# Patient Record
Sex: Male | Born: 1970 | Race: Black or African American | Hispanic: No | Marital: Single | Smoking: Current every day smoker
Health system: Southern US, Community
[De-identification: ages and names within clinical notes are randomized; demographics above are authoritative.]

---

## 2011-02-02 ENCOUNTER — Emergency Department: Payer: Self-pay | Admitting: Emergency Medicine

## 2013-02-03 IMAGING — CR DG CHEST 2V
1 series · 2 of 2 positions shown · non-contrast
Comparison: none

REASON FOR EXAM: dyspnea
COMMENTS:   LMP: (Male)

[Series 1: view not recorded · 0.17mm/px · 2 of 2 slices shown]
[im 1/2]
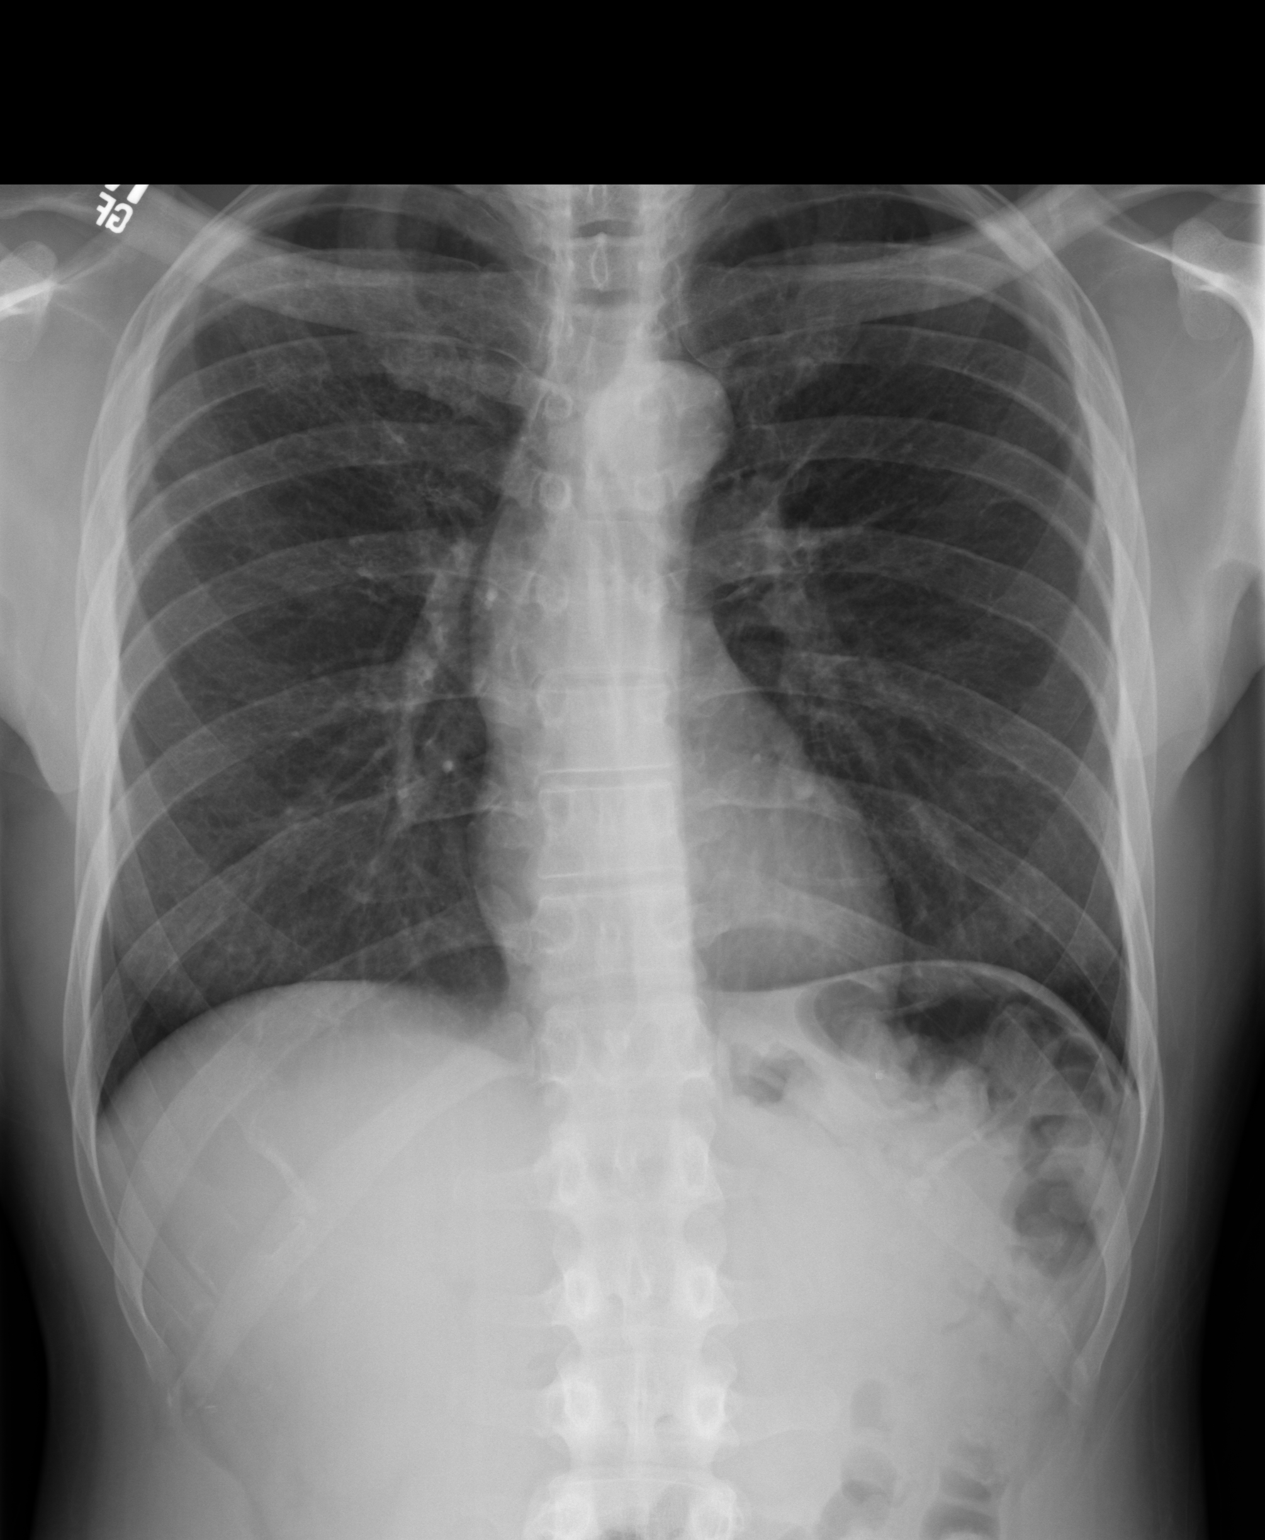
[im 2/2]
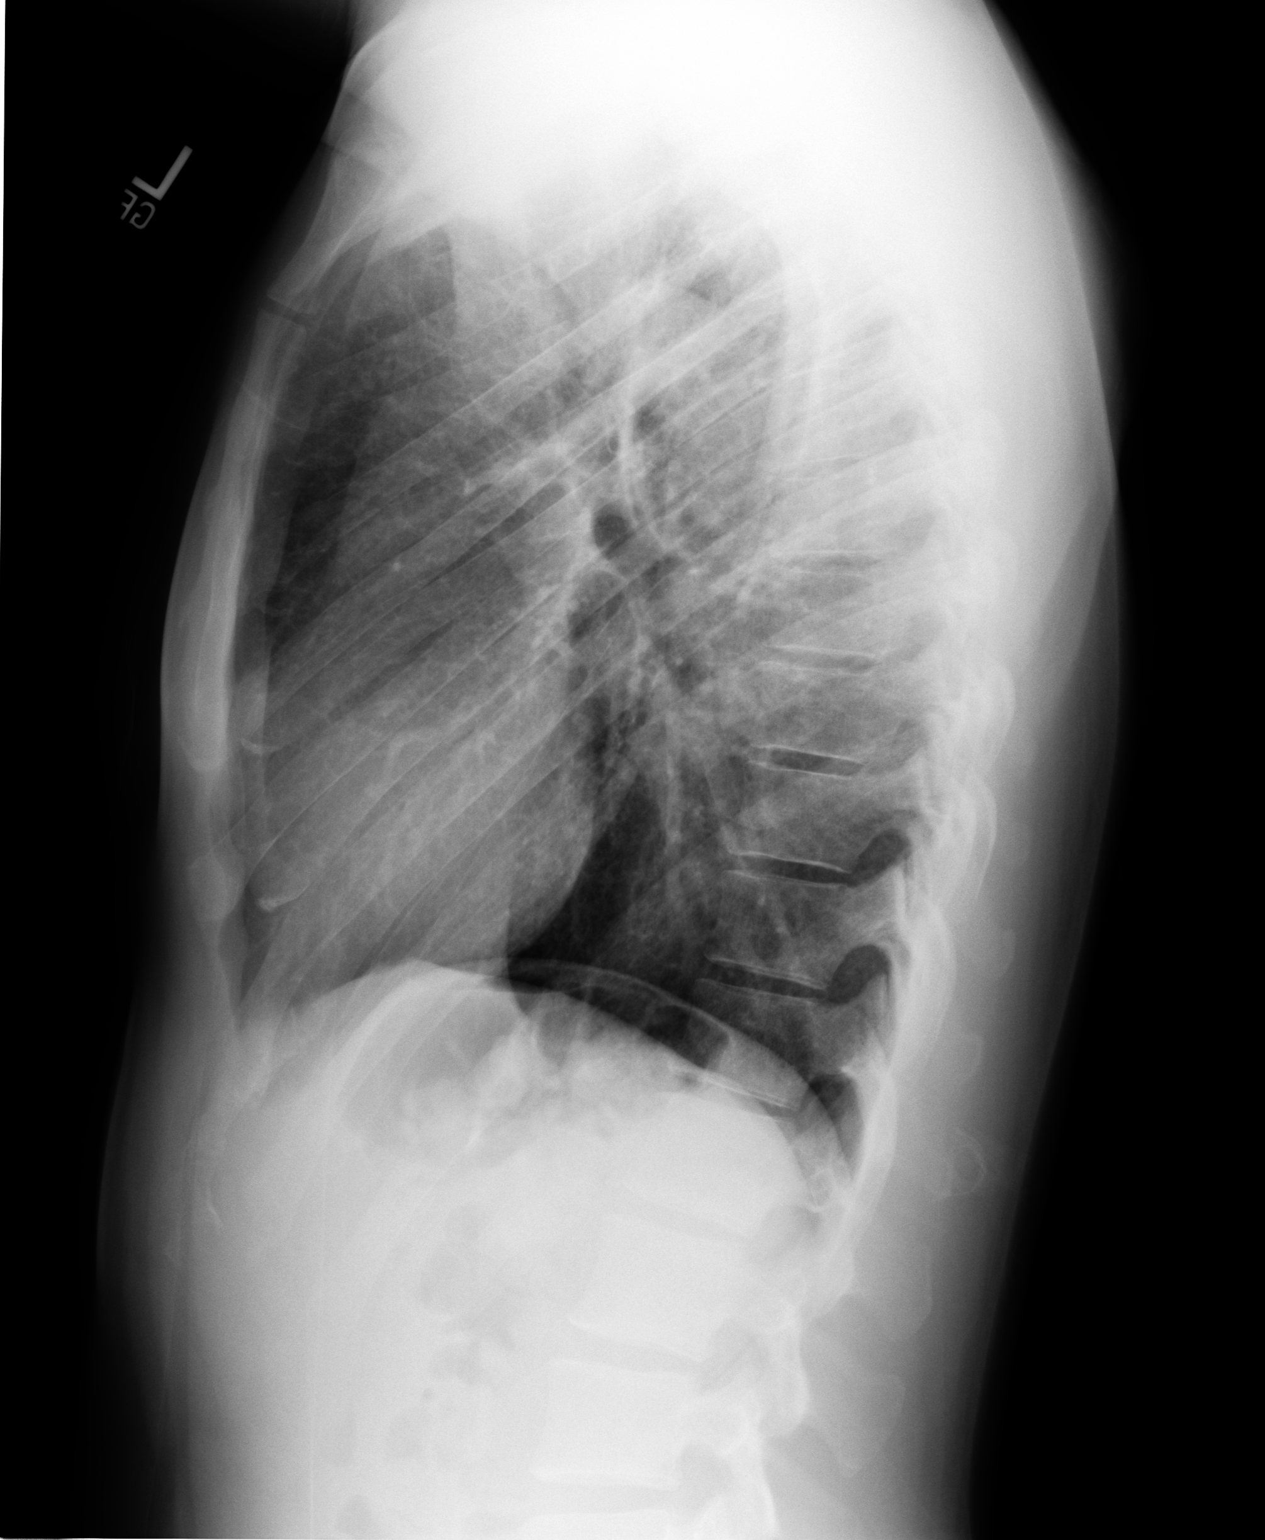

[2 of 2 positions shown; findings below may reference images not displayed]

PROCEDURE:     DXR - DXR CHEST PA (OR AP) AND LATERAL  - February 02, 2011  [DATE]

RESULT:     The lungs are adequately inflated. There is no focal infiltrate.
I see no pleural effusion. The cardiac silhouette is normal in size. The
pulmonary vascularity is not engorged. The bony thorax exhibits no acute
abnormality.
IMPRESSION: I do not see evidence of acute cardiopulmonary abnormality

## 2016-09-19 ENCOUNTER — Emergency Department
Admission: EM | Admit: 2016-09-19 | Discharge: 2016-09-19 | Disposition: A | Discharge status: Home / Self Care | Payer: Worker's Compensation | Attending: Emergency Medicine | Admitting: Emergency Medicine

## 2016-09-19 ENCOUNTER — Emergency Department: Payer: Worker's Compensation

## 2016-09-19 ENCOUNTER — Encounter: Payer: Self-pay | Admitting: Emergency Medicine

## 2016-09-19 DIAGNOSIS — F17210 Nicotine dependence, cigarettes, uncomplicated: Secondary | ICD-10-CM | POA: Diagnosis not present

## 2016-09-19 DIAGNOSIS — Y9389 Activity, other specified: Secondary | ICD-10-CM | POA: Insufficient documentation

## 2016-09-19 DIAGNOSIS — Y990 Civilian activity done for income or pay: Secondary | ICD-10-CM | POA: Insufficient documentation

## 2016-09-19 DIAGNOSIS — W228XXA Striking against or struck by other objects, initial encounter: Secondary | ICD-10-CM | POA: Insufficient documentation

## 2016-09-19 DIAGNOSIS — Y929 Unspecified place or not applicable: Secondary | ICD-10-CM | POA: Diagnosis not present

## 2016-09-19 DIAGNOSIS — Z23 Encounter for immunization: Secondary | ICD-10-CM | POA: Diagnosis not present

## 2016-09-19 DIAGNOSIS — S61031A Puncture wound without foreign body of right thumb without damage to nail, initial encounter: Secondary | ICD-10-CM | POA: Insufficient documentation

## 2016-09-19 DIAGNOSIS — S61239A Puncture wound without foreign body of unspecified finger without damage to nail, initial encounter: Secondary | ICD-10-CM

## 2016-09-19 DIAGNOSIS — S6991XA Unspecified injury of right wrist, hand and finger(s), initial encounter: Secondary | ICD-10-CM | POA: Diagnosis present

## 2016-09-19 MED ORDER — TRAMADOL HCL 50 MG PO TABS: 50.0000 mg | ORAL_TABLET | Freq: Four times a day (QID) | ORAL | 0 refills | Status: DC | PRN

## 2016-09-19 MED ORDER — TETANUS-DIPHTH-ACELL PERTUSSIS 5-2.5-18.5 LF-MCG/0.5 IM SUSP
0.5000 mL | Freq: Once | INTRAMUSCULAR | Status: AC
  Administered 2016-09-19: 0.5 mL via INTRAMUSCULAR
  Filled 2016-09-19: qty 0.5

## 2016-09-19 NOTE — ED Triage Notes (Signed)
[facility] [employer] [facility] [employer] Trying to loosen a bolt, the T handle of the [place] wrench broke , and the broken end went through glove and into right thumb.    Puncture wound to right thumb.  Bleeding controlled.  Darker area seen below puncture wound.

## 2016-09-19 NOTE — ED Provider Notes (Signed)
[facility]-EMERGENCY DEPARTMENT Provider Note   CSN: [id number] Arrival date & time: 09/19/16  2010     History   Chief Complaint Chief Complaint  Patient presents with  . thumb injury    HPI Manuel Miles is a 45 y.o. male presents to the emergency department for evaluation of right thumb pain. Patient was at work just prior to arrival, he was torquing a ranch with his right hand when the wrench broke, stabbed his right thumb. Manuel Miles is metal. His pain is very mild. His tetanus is not up-to-date. He is right-hand dominant. Denies any limited range of motion. No significant swelling. Wound was thoroughly irrigated.  HPI  History reviewed. No pertinent past medical history.  There are no active problems to display for this patient.   History reviewed. No pertinent surgical history.     Home Medications    Prior to Admission medications   Medication Sig Start Date End Date Taking? Authorizing Provider  traMADol (ULTRAM) 50 MG tablet Take 1 tablet (50 mg total) by mouth every 6 (six) hours as needed. 09/19/16   Evon Slack, PA-C    Family History No family history on file.  Social History Social History  Substance Use Topics  . Smoking status: Current Every Day Smoker    Packs/day: 0.50    Types: Cigarettes  . Smokeless tobacco: Never Used  . Alcohol use 0.6 oz/week    1 Cans of beer per week     Comment: daily     Allergies   Review of patient's allergies indicates no known allergies.   Review of Systems Review of Systems  Constitutional: Negative.  Negative for activity change, appetite change, chills and fever.  HENT: Negative for congestion, ear pain, mouth sores, rhinorrhea, sinus pressure, sore throat and trouble swallowing.   Eyes: Negative for photophobia, pain and discharge.  Respiratory: Negative for cough, chest tightness and shortness of breath.   Cardiovascular: Negative for chest pain and leg swelling.  Gastrointestinal: Negative for  abdominal distention, abdominal pain, diarrhea, nausea and vomiting.  Genitourinary: Negative for difficulty urinating and dysuria.  Musculoskeletal: Negative for arthralgias, back pain and gait problem.  Skin: Positive for wound. Negative for color change and rash.  Neurological: Negative for dizziness and headaches.  Hematological: Negative for adenopathy.  Psychiatric/Behavioral: Negative for agitation and behavioral problems.     Physical Exam Updated Vital Signs BP (!) 155/97 (BP Location: Right Arm)   Pulse 89   Temp 98 F (36.7 C) (Oral)   Resp 16   Ht 5\' 11"  (1.803 m)   Wt 72.6 kg   SpO2 100%   BMI 22.32 kg/m   Physical Exam  Constitutional: He appears well-developed and well-nourished.  HENT:  Head: Normocephalic and atraumatic.  Eyes: Conjunctivae are normal.  Neck: Neck supple.  Cardiovascular: Normal rate.   Pulmonary/Chest: Effort normal. No respiratory distress.  Musculoskeletal: He exhibits no edema.  Examination the right hand and thumb shows the patient has no swelling warmth erythema. The volar aspect of the right thumb at the distal phalanx shows a small puncture wound, 3-4 mm in diameter. There is no palpable foreign body. He is nontender to palpation. No tendon deficits noted.  Neurological: He is alert.  Skin: Skin is warm and dry.  Psychiatric: He has a normal mood and affect.  Nursing note and vitals reviewed.    ED Treatments / Results  Labs (all labs ordered are listed, but only abnormal results are displayed) Labs Reviewed - No data  to display  EKG  EKG Interpretation None       Radiology Dg Finger Thumb Right  Result Date: 09/19/2016 CLINICAL DATA:  Puncture wound to right thumb. Thumb pain and swelling. Initial encounter. EXAM: RIGHT THUMB 2+V COMPARISON:  None. FINDINGS: There is no evidence of fracture or dislocation. There is no evidence of arthropathy or other focal bone abnormality. No evidence of radiopaque foreign body.  IMPRESSION: Negative.  No evidence of fracture or radiopaque foreign body. Electronically Signed   By: Myles RosenthalJohn  Stahl M.D.   On: 09/19/2016 20:48    Procedures Procedures (including critical care time)  Medications Ordered in ED Medications  Tdap (BOOSTRIX) injection 0.5 mL (not administered)     Initial Impression / Assessment and Plan / ED Course  I have reviewed the triage vital signs and the nursing notes.  Pertinent labs & imaging results that were available during my care of the patient were reviewed by me and considered in my medical decision making (see chart for details).  Clinical Course    45 year old male with puncture wound to the right thumb. Puncture wound appears to be superficial. X-rays are negative showing no radiopaque foreign body and no evidence of acute fracture. There appears to be no tendon deficits noted. He is given a prescription for tramadol for pain. He will keep clean and dry and soap and peroxide and tap water daily. He will call for any signs of infection. Patient can return to work on Monday, 09/23/2016.  Final Clinical Impressions(s) / ED Diagnoses   Final diagnoses:  Puncture wound of finger of right hand, initial encounter    New Prescriptions New Prescriptions   TRAMADOL (ULTRAM) 50 MG TABLET    Take 1 tablet (50 mg total) by mouth every 6 (six) hours as needed.     Evon Slackhomas C Poseidon Pam, PA-C 09/19/16 2222    Jennye MoccasinBrian S Quigley, MD 09/19/16 [phone]

## 2016-09-19 NOTE — ED Notes (Signed)
Workman's Comp completed. Papers on the chart and specimen walked down to lab.

## 2016-09-19 NOTE — Discharge Instructions (Signed)
Please soak thumb and half peroxide, half water 3 times daily for 2-3 days. Wear a Band-Aid, keep puncture wound site clean. Return to the ER for any increased pain, redness warmth or drainage.

## 2018-02-19 ENCOUNTER — Encounter: Payer: Self-pay | Admitting: Family Medicine

## 2018-02-19 ENCOUNTER — Ambulatory Visit (INDEPENDENT_AMBULATORY_CARE_PROVIDER_SITE_OTHER): Payer: BLUE CROSS/BLUE SHIELD | Admitting: Family Medicine

## 2018-02-19 VITALS — BP 144/86 | HR 80 | Ht 71.0 in | Wt 162.0 lb

## 2018-02-19 DIAGNOSIS — L738 Other specified follicular disorders: Secondary | ICD-10-CM | POA: Diagnosis not present

## 2018-02-19 DIAGNOSIS — R030 Elevated blood-pressure reading, without diagnosis of hypertension: Secondary | ICD-10-CM | POA: Diagnosis not present

## 2018-02-19 DIAGNOSIS — L700 Acne vulgaris: Secondary | ICD-10-CM | POA: Diagnosis not present

## 2018-02-19 MED ORDER — DOXYCYCLINE HYCLATE 100 MG PO TABS: 100.0000 mg | ORAL_TABLET | Freq: Two times a day (BID) | ORAL | 0 refills | Status: DC

## 2018-02-19 NOTE — Patient Instructions (Addendum)
Folliculitis Folliculitis is inflammation of the hair follicles. Folliculitis most commonly occurs on the scalp, thighs, legs, back, and buttocks. However, it can occur anywhere on the body. What are the causes? This condition may be caused by:  A bacterial infection (common).  A fungal infection.  A viral infection.  Coming into contact with certain chemicals, especially oils and tars.  Shaving or waxing.  Applying greasy ointments or creams to your skin often.  Long-lasting folliculitis and folliculitis that keeps coming back can be caused by bacteria that live in the nostrils. What increases the risk? This condition is more likely to develop in people with:  A weakened immune system.  Diabetes.  Obesity.  What are the signs or symptoms? Symptoms of this condition include:  Redness.  Soreness.  Swelling.  Itching.  Small white or yellow, pus-filled, itchy spots (pustules) that appear over a reddened area. If there is an infection that goes deep into the follicle, these may develop into a boil (furuncle).  A group of closely packed boils (carbuncle). These tend to form in hairy, sweaty areas of the body.  How is this diagnosed? This condition is diagnosed with a skin exam. To find what is causing the condition, your health care provider may take a sample of one of the pustules or boils for testing. How is this treated? This condition may be treated by:  Applying warm compresses to the affected areas.  Taking an antibiotic medicine or applying an antibiotic medicine to the skin.  Applying or bathing with an antiseptic solution.  Taking an over-the-counter medicine to help with itching.  Having a procedure to drain any pustules or boils. This may be done if a pustule or boil contains a lot of pus or fluid.  Laser hair removal. This may be done to treat long-lasting folliculitis.  Follow these instructions at home:  If directed, apply heat to the affected  area as often as told by your health care provider. Use the heat source that your health care provider recommends, such as a moist heat pack or a heating pad. ? Place a towel between your skin and the heat source. ? Leave the heat on for 20-30 minutes. ? Remove the heat if your skin turns bright red. This is especially important if you are unable to feel pain, heat, or cold. You may have a greater risk of getting burned.  If you were prescribed an antibiotic medicine, use it as told by your health care provider. Do not stop using the antibiotic even if you start to feel better.  Take over-the-counter and prescription medicines only as told by your health care provider.  Do not shave irritated skin.  Keep all follow-up visits as told by your health care provider. This is important. Get help right away if:  You have more redness, swelling, or pain in the affected area.  Red streaks are spreading from the affected area.  You have a fever. This information is not intended to replace advice given to you by your health care provider. Make sure you discuss any questions you have with your health care provider. Document Released: 01/20/2002 Document Revised: 05/31/2016 Document Reviewed: 09/01/2015 Elsevier Interactive Patient Education  2018 [employer]  DASH Eating Plan DASH stands for "Dietary Approaches to Stop Hypertension." The DASH eating plan is a healthy eating plan that has been shown to reduce high blood pressure (hypertension). It may also reduce your risk for type 2 diabetes, heart disease, and stroke. The DASH eating  plan may also help with weight loss. What are tips for following this plan? General guidelines  Avoid eating more than 2,300 mg (milligrams) of salt (sodium) a day. If you have hypertension, you may need to reduce your sodium intake to 1,500 mg a day.  Limit alcohol intake to no more than 1 drink a day for nonpregnant women and 2 drinks a day for men. One drink  equals 12 oz of beer, 5 oz of wine, or 1 oz of hard liquor.  Work with your health care provider to maintain a healthy body weight or to lose weight. Ask what an ideal weight is for you.  Get at least 30 minutes of exercise that causes your heart to beat faster (aerobic exercise) most days of the week. Activities may include walking, swimming, or biking.  Work with your health care provider or diet and nutrition specialist (dietitian) to adjust your eating plan to your individual calorie needs. Reading food labels  Check food labels for the amount of sodium per serving. Choose foods with less than 5 percent of the Daily Value of sodium. Generally, foods with less than 300 mg of sodium per serving fit into this eating plan.  To find whole grains, look for the word "whole" as the first word in the ingredient list. Shopping  Buy products labeled as "low-sodium" or "no salt added."  Buy fresh foods. Avoid canned foods and premade or frozen meals. Cooking  Avoid adding salt when cooking. Use salt-free seasonings or herbs instead of table salt or sea salt. Check with your health care provider or pharmacist before using salt substitutes.  Do not fry foods. Cook foods using healthy methods such as baking, boiling, grilling, and broiling instead.  Cook with heart-healthy oils, such as olive, canola, soybean, or sunflower oil. Meal planning   Eat a balanced diet that includes: ? 5 or more servings of fruits and vegetables each day. At each meal, try to fill half of your plate with fruits and vegetables. ? Up to 6-8 servings of whole grains each day. ? Less than 6 oz of lean meat, poultry, or fish each day. A 3-oz serving of meat is about the same size as a deck of cards. One egg equals 1 oz. ? 2 servings of low-fat dairy each day. ? A serving of nuts, seeds, or beans 5 times each week. ? Heart-healthy fats. Healthy fats called Omega-3 fatty acids are found in foods such as flaxseeds and  coldwater fish, like sardines, salmon, and mackerel.  Limit how much you eat of the following: ? Canned or prepackaged foods. ? Food that is high in trans fat, such as fried foods. ? Food that is high in saturated fat, such as fatty meat. ? Sweets, desserts, sugary drinks, and other foods with added sugar. ? Full-fat dairy products.  Do not salt foods before eating.  Try to eat at least 2 vegetarian meals each week.  Eat more home-cooked food and less restaurant, buffet, and fast food.  When eating at a restaurant, ask that your food be prepared with less salt or no salt, if possible. What foods are recommended? The items listed may not be a complete list. Talk with your dietitian about what dietary choices are best for you. Grains Whole-grain or whole-wheat bread. Whole-grain or whole-wheat pasta. Brown rice. Orpah Cobb. Bulgur. Whole-grain and low-sodium cereals. Pita bread. Low-fat, low-sodium crackers. Whole-wheat flour tortillas. Vegetables Fresh or frozen vegetables (raw, steamed, roasted, or grilled). Low-sodium or reduced-sodium tomato  and vegetable juice. Low-sodium or reduced-sodium tomato sauce and tomato paste. Low-sodium or reduced-sodium canned vegetables. Fruits All fresh, dried, or frozen fruit. Canned fruit in natural juice (without added sugar). Meat and other protein foods Skinless chicken or [place]. Ground chicken or [place]. Pork with fat trimmed off. Fish and seafood. Egg whites. Dried beans, peas, or lentils. Unsalted nuts, nut butters, and seeds. Unsalted canned beans. Lean cuts of beef with fat trimmed off. Low-sodium, lean deli meat. Dairy Low-fat (1%) or fat-free (skim) milk. Fat-free, low-fat, or reduced-fat cheeses. Nonfat, low-sodium ricotta or cottage cheese. Low-fat or nonfat yogurt. Low-fat, low-sodium cheese. Fats and oils Soft margarine without trans fats. Vegetable oil. Low-fat, reduced-fat, or light mayonnaise and salad dressings (reduced-sodium).  Canola, safflower, olive, soybean, and sunflower oils. Avocado. Seasoning and other foods Herbs. Spices. Seasoning mixes without salt. Unsalted popcorn and pretzels. Fat-free sweets. What foods are not recommended? The items listed may not be a complete list. Talk with your dietitian about what dietary choices are best for you. Grains Baked goods made with fat, such as croissants, muffins, or some breads. Dry pasta or rice meal packs. Vegetables Creamed or fried vegetables. Vegetables in a cheese sauce. Regular canned vegetables (not low-sodium or reduced-sodium). Regular canned tomato sauce and paste (not low-sodium or reduced-sodium). Regular tomato and vegetable juice (not low-sodium or reduced-sodium). Rosita Fire. Olives. Fruits Canned fruit in a light or heavy syrup. Fried fruit. Fruit in cream or butter sauce. Meat and other protein foods Fatty cuts of meat. Ribs. Fried meat. Tomasa Blase. Sausage. Bologna and other processed lunch meats. Salami. Fatback. Hotdogs. Bratwurst. Salted nuts and seeds. Canned beans with added salt. Canned or smoked fish. Whole eggs or egg yolks. Chicken or [place] with skin. Dairy Whole or 2% milk, cream, and half-and-half. Whole or full-fat cream cheese. Whole-fat or sweetened yogurt. Full-fat cheese. Nondairy creamers. Whipped toppings. Processed cheese and cheese spreads. Fats and oils Butter. Stick margarine. Lard. Shortening. Ghee. Bacon fat. Tropical oils, such as coconut, palm kernel, or palm oil. Seasoning and other foods Salted popcorn and pretzels. Onion salt, garlic salt, seasoned salt, table salt, and sea salt. Worcestershire sauce. Tartar sauce. Barbecue sauce. Teriyaki sauce. Soy sauce, including reduced-sodium. Steak sauce. Canned and packaged gravies. Fish sauce. Oyster sauce. Cocktail sauce. Horseradish that you find on the shelf. Ketchup. Mustard. Meat flavorings and tenderizers. Bouillon cubes. Hot sauce and Tabasco sauce. Premade or packaged marinades.  Premade or packaged taco seasonings. Relishes. Regular salad dressings. Where to find more information:  National Heart, Lung, and Blood Institute: PopSteam.is  American Heart Association: www.heart.org Summary  The DASH eating plan is a healthy eating plan that has been shown to reduce high blood pressure (hypertension). It may also reduce your risk for type 2 diabetes, heart disease, and stroke.  With the DASH eating plan, you should limit salt (sodium) intake to 2,300 mg a day. If you have hypertension, you may need to reduce your sodium intake to 1,500 mg a day.  When on the DASH eating plan, aim to eat more fresh fruits and vegetables, whole grains, lean proteins, low-fat dairy, and heart-healthy fats.  Work with your health care provider or diet and nutrition specialist (dietitian) to adjust your eating plan to your individual calorie needs. This information is not intended to replace advice given to you by your health care provider. Make sure you discuss any questions you have with your health care provider. Document Released: 10/31/2011 Document Revised: 11/04/2016 Document Reviewed: 11/04/2016 Elsevier Interactive Patient Education  2018  [employer].

## 2018-02-19 NOTE — Progress Notes (Signed)
Name: Manuel Miles   MRN: [id number]    DOB: Nov 15, 1971   Date:02/19/2018       Progress Note  Subjective  Chief Complaint  Chief Complaint  Patient presents with  . Establish Care    needed pcp  . Cyst    come up on face- they will get bigger    Patient presents to establish care with new physician.  Rash  This is a recurrent ("hair bumps") problem. The current episode started more than 1 year ago. The problem has been waxing and waning since onset. The affected locations include the face. The rash is characterized by draining, redness and pain. He was exposed to nothing. Pertinent negatives include no anorexia, congestion, cough, diarrhea, eye pain, facial edema, fatigue, fever, joint pain, nail changes, rhinorrhea, shortness of breath, sore throat or vomiting. Past treatments include nothing.    No problem-specific Assessment & Plan notes found for this encounter.   History reviewed. No pertinent past medical history.  History reviewed. No pertinent surgical history.  Family History  Problem Relation Age of Onset  . Diabetes Mother   . Hypertension Mother     Social History   Socioeconomic History  . Marital status: Single    Spouse name: Not on file  . Number of children: Not on file  . Years of education: Not on file  . Highest education level: Not on file  Occupational History  . Not on file  Social Needs  . Financial resource strain: Not on file  . Food insecurity:    Worry: Not on file    Inability: Not on file  . Transportation needs:    Medical: Not on file    Non-medical: Not on file  Tobacco Use  . Smoking status: Current Every Day Smoker    Packs/day: 0.50    Types: Cigarettes  . Smokeless tobacco: Never Used  Substance and Sexual Activity  . Alcohol use: Yes    Alcohol/week: 0.6 oz    Types: 1 Cans of beer per week    Comment: daily  . Drug use: No  . Sexual activity: Yes  Lifestyle  . Physical activity:    Days per week: Not on file   Minutes per session: Not on file  . Stress: Not on file  Relationships  . Social connections:    Talks on phone: Not on file    Gets together: Not on file    Attends religious service: Not on file    Active member of club or organization: Not on file    Attends meetings of clubs or organizations: Not on file    Relationship status: Not on file  . Intimate partner violence:    Fear of current or ex partner: Not on file    Emotionally abused: Not on file    Physically abused: Not on file    Forced sexual activity: Not on file  Other Topics Concern  . Not on file  Social History Narrative  . Not on file    No Known Allergies  Outpatient Medications Prior to Visit  Medication Sig Dispense Refill  . traMADol (ULTRAM) 50 MG tablet Take 1 tablet (50 mg total) by mouth every 6 (six) hours as needed. 10 tablet 0   No facility-administered medications prior to visit.     Review of Systems  Constitutional: Negative for chills, fatigue, fever, malaise/fatigue and weight loss.  HENT: Negative for congestion, ear discharge, ear pain, rhinorrhea and sore throat.   Eyes:  Negative for blurred vision and pain.  Respiratory: Negative for cough, sputum production, shortness of breath and wheezing.   Cardiovascular: Negative for chest pain, palpitations and leg swelling.  Gastrointestinal: Negative for abdominal pain, anorexia, blood in stool, constipation, diarrhea, heartburn, melena, nausea and vomiting.  Genitourinary: Negative for dysuria, frequency, hematuria and urgency.  Musculoskeletal: Negative for back pain, joint pain, myalgias and neck pain.  Skin: Positive for rash. Negative for nail changes.       Bump on lips/2 years  Neurological: Negative for dizziness, tingling, sensory change, focal weakness and headaches.  Endo/Heme/Allergies: Negative for environmental allergies and polydipsia. Does not bruise/bleed easily.  Psychiatric/Behavioral: Negative for depression and suicidal  ideas. The patient is not nervous/anxious and does not have insomnia.      Objective  Vitals:   02/19/18 1547  BP: (!) 144/86  Pulse: 80  Weight: 162 lb (73.5 kg)  Height: 5\' 11"  (1.803 m)    Physical Exam  Constitutional: He is oriented to person, place, and time and well-developed, well-nourished, and in no distress.  HENT:  Head: Normocephalic.  Right Ear: External ear normal.  Left Ear: External ear normal.  Nose: Nose normal.  Mouth/Throat: Oropharynx is clear and moist.  Eyes: Pupils are equal, round, and reactive to light. Conjunctivae and EOM are normal. Right eye exhibits no discharge. Left eye exhibits no discharge. No scleral icterus.  Neck: Normal range of motion. Neck supple. No JVD present. No tracheal deviation present. No thyromegaly present.  Cardiovascular: Normal rate, regular rhythm, normal heart sounds and intact distal pulses. Exam reveals no gallop and no friction rub.  No murmur heard. Pulmonary/Chest: Breath sounds normal. No respiratory distress. He has no wheezes. He has no rales.  Abdominal: Soft. Bowel sounds are normal. He exhibits no mass. There is no hepatosplenomegaly. There is no tenderness. There is no rebound, no guarding and no CVA tenderness.  Musculoskeletal: Normal range of motion. He exhibits no edema or tenderness.  Lymphadenopathy:       Head (right side): Submental and submandibular adenopathy present.       Head (left side): Submental and submandibular adenopathy present.    He has cervical adenopathy.       Right cervical: Superficial cervical adenopathy present.       Left cervical: Superficial cervical adenopathy present.    He has no axillary adenopathy.  Neurological: He is alert and oriented to person, place, and time. He has normal sensation, normal strength, normal reflexes and intact cranial nerves. No cranial nerve deficit.  Skin: Skin is warm. No rash noted.  Closed/open comedones/ follicular infection   Psychiatric: Mood  and affect normal.  Nursing note and vitals reviewed.     Assessment & Plan  Problem List Items Addressed This Visit    None    Visit Diagnoses    Folliculitis barbae  (Chronic)   -  Primary   Relevant Medications   doxycycline (VIBRA-TABS) 100 MG tablet   Other Relevant Orders   Ambulatory referral to Dermatology   Acne vulgaris       Relevant Medications   doxycycline (VIBRA-TABS) 100 MG tablet   Other Relevant Orders   Ambulatory referral to Dermatology   Elevated blood pressure reading          Meds ordered this encounter  Medications  . doxycycline (VIBRA-TABS) 100 MG tablet    Sig: Take 1 tablet (100 mg total) by mouth 2 (two) times daily.    Dispense:  30 tablet  Refill:  0      Dr. Hay[facility]  02/19/18

## 2018-04-02 ENCOUNTER — Encounter: Payer: Self-pay | Admitting: Family Medicine

## 2018-04-02 ENCOUNTER — Ambulatory Visit (INDEPENDENT_AMBULATORY_CARE_PROVIDER_SITE_OTHER): Payer: BLUE CROSS/BLUE SHIELD | Admitting: Family Medicine

## 2018-04-02 VITALS — BP 140/82 | HR 80 | Ht 71.0 in | Wt 158.0 lb

## 2018-04-02 DIAGNOSIS — G44209 Tension-type headache, unspecified, not intractable: Secondary | ICD-10-CM | POA: Diagnosis not present

## 2018-04-02 DIAGNOSIS — I10 Essential (primary) hypertension: Secondary | ICD-10-CM | POA: Diagnosis not present

## 2018-04-02 DIAGNOSIS — F419 Anxiety disorder, unspecified: Secondary | ICD-10-CM

## 2018-04-02 DIAGNOSIS — F329 Major depressive disorder, single episode, unspecified: Secondary | ICD-10-CM | POA: Diagnosis not present

## 2018-04-02 DIAGNOSIS — F32A Depression, unspecified: Secondary | ICD-10-CM

## 2018-04-02 MED ORDER — SERTRALINE HCL 25 MG PO TABS: 25.0000 mg | ORAL_TABLET | Freq: Every day | ORAL | 0 refills | Status: DC

## 2018-04-02 MED ORDER — HYDROCHLOROTHIAZIDE 12.5 MG PO TABS
12.5000 mg | ORAL_TABLET | Freq: Every day | ORAL | 1 refills | Status: DC
Start: 2018-04-02 — End: 2024-04-04

## 2018-04-02 NOTE — Progress Notes (Signed)
Name: Manuel Miles   MRN: [id number]    DOB: 1971/02/28   Date:04/02/2018       Progress Note  Subjective  Chief Complaint  Chief Complaint  Patient presents with  . Hypertension    recheck b/p  . Headache    When headache starts, gets nauseated and feels "like I'm going to throw up"  . Anxiety    starts when thinking about traumatic event or worrying about money    Hypertension  This is a new problem. The current episode started more than 1 year ago. The problem has been waxing and waning since onset. The problem is controlled. Associated symptoms include anxiety and headaches. Pertinent negatives include no blurred vision, chest pain, malaise/fatigue, neck pain, orthopnea, palpitations, peripheral edema, PND, shortness of breath or sweats. There are no associated agents to hypertension. There are no known risk factors for coronary artery disease. Past treatments include lifestyle changes. The current treatment provides moderate improvement. There are no compliance problems.  There is no history of angina, kidney disease, CAD/MI, CVA, heart failure, left ventricular hypertrophy, PVD or retinopathy. There is no history of chronic renal disease, a hypertension causing med or renovascular disease.  Headache   This is a chronic problem. The current episode started more than 1 year ago. The problem occurs intermittently. The problem has been gradually worsening. The pain is located in the right unilateral and temporal region. The pain does not radiate. The quality of the pain is described as throbbing. The pain is at a severity of 7/10. The pain is moderate. Pertinent negatives include no abdominal pain, abnormal behavior, anorexia, back pain, blurred vision, coughing, dizziness, drainage, ear pain, eye pain, eye redness, eye watering, facial sweating, fever, hearing loss, insomnia, loss of balance, muscle aches, nausea, neck pain, numbness, phonophobia, photophobia, rhinorrhea, scalp tenderness,  seizures, sinus pressure, sore throat, swollen glands, tingling, tinnitus, visual change, vomiting, weakness or weight loss. The symptoms are aggravated by bright light and noise. He has tried acetaminophen and NSAIDs for the symptoms. The treatment provided no relief. His past medical history is significant for hypertension and migraines in the family.  Anxiety  Presents for follow-up visit. Symptoms include excessive worry, nervous/anxious behavior and panic. Patient reports no chest pain, compulsions, confusion, decreased concentration, depressed mood, dizziness, dry mouth, feeling of choking, hyperventilation, impotence, insomnia, irritability, malaise, muscle tension, nausea, obsessions, palpitations, restlessness, shortness of breath or suicidal ideas. Symptoms occur occasionally. The severity of symptoms is mild.    Depression         This is a recurrent problem.  The current episode started more than 1 year ago.   The problem occurs daily.  Associated symptoms include decreased interest, headaches and sad.  Associated symptoms include no decreased concentration, no fatigue, no helplessness, no hopelessness, does not have insomnia, not irritable, no restlessness, no appetite change, no body aches, no myalgias, no indigestion and no suicidal ideas.     The symptoms are aggravated by work stress.  Past treatments include nothing.  Previous treatment provided moderate relief.  Past medical history includes anxiety.     No problem-specific Assessment & Plan notes found for this encounter.   No past medical history on file.  No past surgical history on file.  Family History  Problem Relation Age of Onset  . Diabetes Mother   . Hypertension Mother     Social History   Socioeconomic History  . Marital status: Single    Spouse name: Not on file  .  Number of children: Not on file  . Years of education: Not on file  . Highest education level: Not on file  Occupational History  . Not on  file  Social Needs  . Financial resource strain: Not on file  . Food insecurity:    Worry: Not on file    Inability: Not on file  . Transportation needs:    Medical: Not on file    Non-medical: Not on file  Tobacco Use  . Smoking status: Current Every Day Smoker    Packs/day: 0.50    Types: Cigarettes  . Smokeless tobacco: Never Used  Substance and Sexual Activity  . Alcohol use: Yes    Alcohol/week: 0.6 oz    Types: 1 Cans of beer per week    Comment: daily  . Drug use: No  . Sexual activity: Yes  Lifestyle  . Physical activity:    Days per week: Not on file    Minutes per session: Not on file  . Stress: Not on file  Relationships  . Social connections:    Talks on phone: Not on file    Gets together: Not on file    Attends religious service: Not on file    Active member of club or organization: Not on file    Attends meetings of clubs or organizations: Not on file    Relationship status: Not on file  . Intimate partner violence:    Fear of current or ex partner: Not on file    Emotionally abused: Not on file    Physically abused: Not on file    Forced sexual activity: Not on file  Other Topics Concern  . Not on file  Social History Narrative  . Not on file    No Known Allergies  Outpatient Medications Prior to Visit  Medication Sig Dispense Refill  . doxycycline (VIBRA-TABS) 100 MG tablet Take 1 tablet (100 mg total) by mouth 2 (two) times daily. 30 tablet 0   No facility-administered medications prior to visit.     Review of Systems  Constitutional: Negative for appetite change, chills, fatigue, fever, irritability, malaise/fatigue and weight loss.  HENT: Negative for ear discharge, ear pain, hearing loss, rhinorrhea, sinus pressure, sore throat and tinnitus.   Eyes: Negative for blurred vision, photophobia, pain and redness.  Respiratory: Negative for cough, sputum production, shortness of breath and wheezing.   Cardiovascular: Negative for chest pain,  palpitations, orthopnea, leg swelling and PND.  Gastrointestinal: Negative for abdominal pain, anorexia, blood in stool, constipation, diarrhea, heartburn, melena, nausea and vomiting.  Genitourinary: Negative for dysuria, frequency, hematuria, impotence and urgency.  Musculoskeletal: Negative for back pain, joint pain, myalgias and neck pain.  Skin: Negative for rash.  Neurological: Positive for headaches. Negative for dizziness, tingling, sensory change, focal weakness, seizures, weakness, numbness and loss of balance.  Endo/Heme/Allergies: Negative for environmental allergies and polydipsia. Does not bruise/bleed easily.  Psychiatric/Behavioral: Negative for confusion, decreased concentration, depression and suicidal ideas. The patient is nervous/anxious. The patient does not have insomnia.      Objective  Vitals:   04/02/18 1546  BP: 140/82  Pulse: 80  Weight: 158 lb (71.7 kg)  Height:  (1.803 m)    Physical Exam  Constitutional: He is oriented to person, place, and time. He is not irritable.  HENT:  Head: Normocephalic.  Right Ear: External ear normal.  Left Ear: External ear normal.  Nose: Nose normal.  Mouth/Throat: Oropharynx is clear and moist.  Eyes: Pupils are  equal, round, and reactive to light. Conjunctivae and EOM are normal. Right eye exhibits no discharge. Left eye exhibits no discharge. No scleral icterus.  Neck: Normal range of motion. Neck supple. No JVD present. No tracheal deviation present. No thyromegaly present.  Cardiovascular: Normal rate, regular rhythm, normal heart sounds and intact distal pulses. Exam reveals no gallop and no friction rub.  No murmur heard. Pulmonary/Chest: Breath sounds normal. No respiratory distress. He has no wheezes. He has no rales.  Abdominal: Soft. Bowel sounds are normal. He exhibits no mass. There is no hepatosplenomegaly. There is no tenderness. There is no rebound, no guarding and no CVA tenderness.  Musculoskeletal:  Normal range of motion. He exhibits no edema or tenderness.  Lymphadenopathy:    He has no cervical adenopathy.  Neurological: He is alert and oriented to person, place, and time. He has normal strength and normal reflexes. No cranial nerve deficit.  Skin: Skin is warm. No rash noted.  Nursing note and vitals reviewed.     Assessment & Plan  Problem List Items Addressed This Visit    None    Visit Diagnoses    Essential hypertension    -  Primary   Relevant Medications   hydrochlorothiazide (HYDRODIURIL) 12.5 MG tablet   Anxiety and depression       Relevant Medications   sertraline (ZOLOFT) 25 MG tablet   Tension headache       Relevant Medications   sertraline (ZOLOFT) 25 MG tablet      Meds ordered this encounter  Medications  . hydrochlorothiazide (HYDRODIURIL) 12.5 MG tablet    Sig: Take 1 tablet (12.5 mg total) by mouth daily.    Dispense:  30 tablet    Refill:  1  . sertraline (ZOLOFT) 25 MG tablet    Sig: Take 1 tablet (25 mg total) by mouth daily.    Dispense:  30 tablet    Refill:  0    One half tablet for 14 days then 1 a day      Dr. [facility]  04/02/18

## 2018-05-04 ENCOUNTER — Ambulatory Visit: Payer: BLUE CROSS/BLUE SHIELD | Admitting: Family Medicine

## 2018-05-05 DIAGNOSIS — L720 Epidermal cyst: Secondary | ICD-10-CM | POA: Diagnosis not present

## 2018-05-11 ENCOUNTER — Ambulatory Visit: Payer: BLUE CROSS/BLUE SHIELD | Admitting: Family Medicine

## 2018-05-18 ENCOUNTER — Ambulatory Visit: Payer: BLUE CROSS/BLUE SHIELD | Admitting: Family Medicine

## 2018-09-21 IMAGING — DX DG FINGER THUMB 2+V*R*
3 series · 3 of 3 positions shown · non-contrast
Comparison: None.

CLINICAL DATA: Puncture wound to right thumb. Thumb pain and
swelling. Initial encounter.

EXAM:
RIGHT THUMB 2+V

[finger ap]
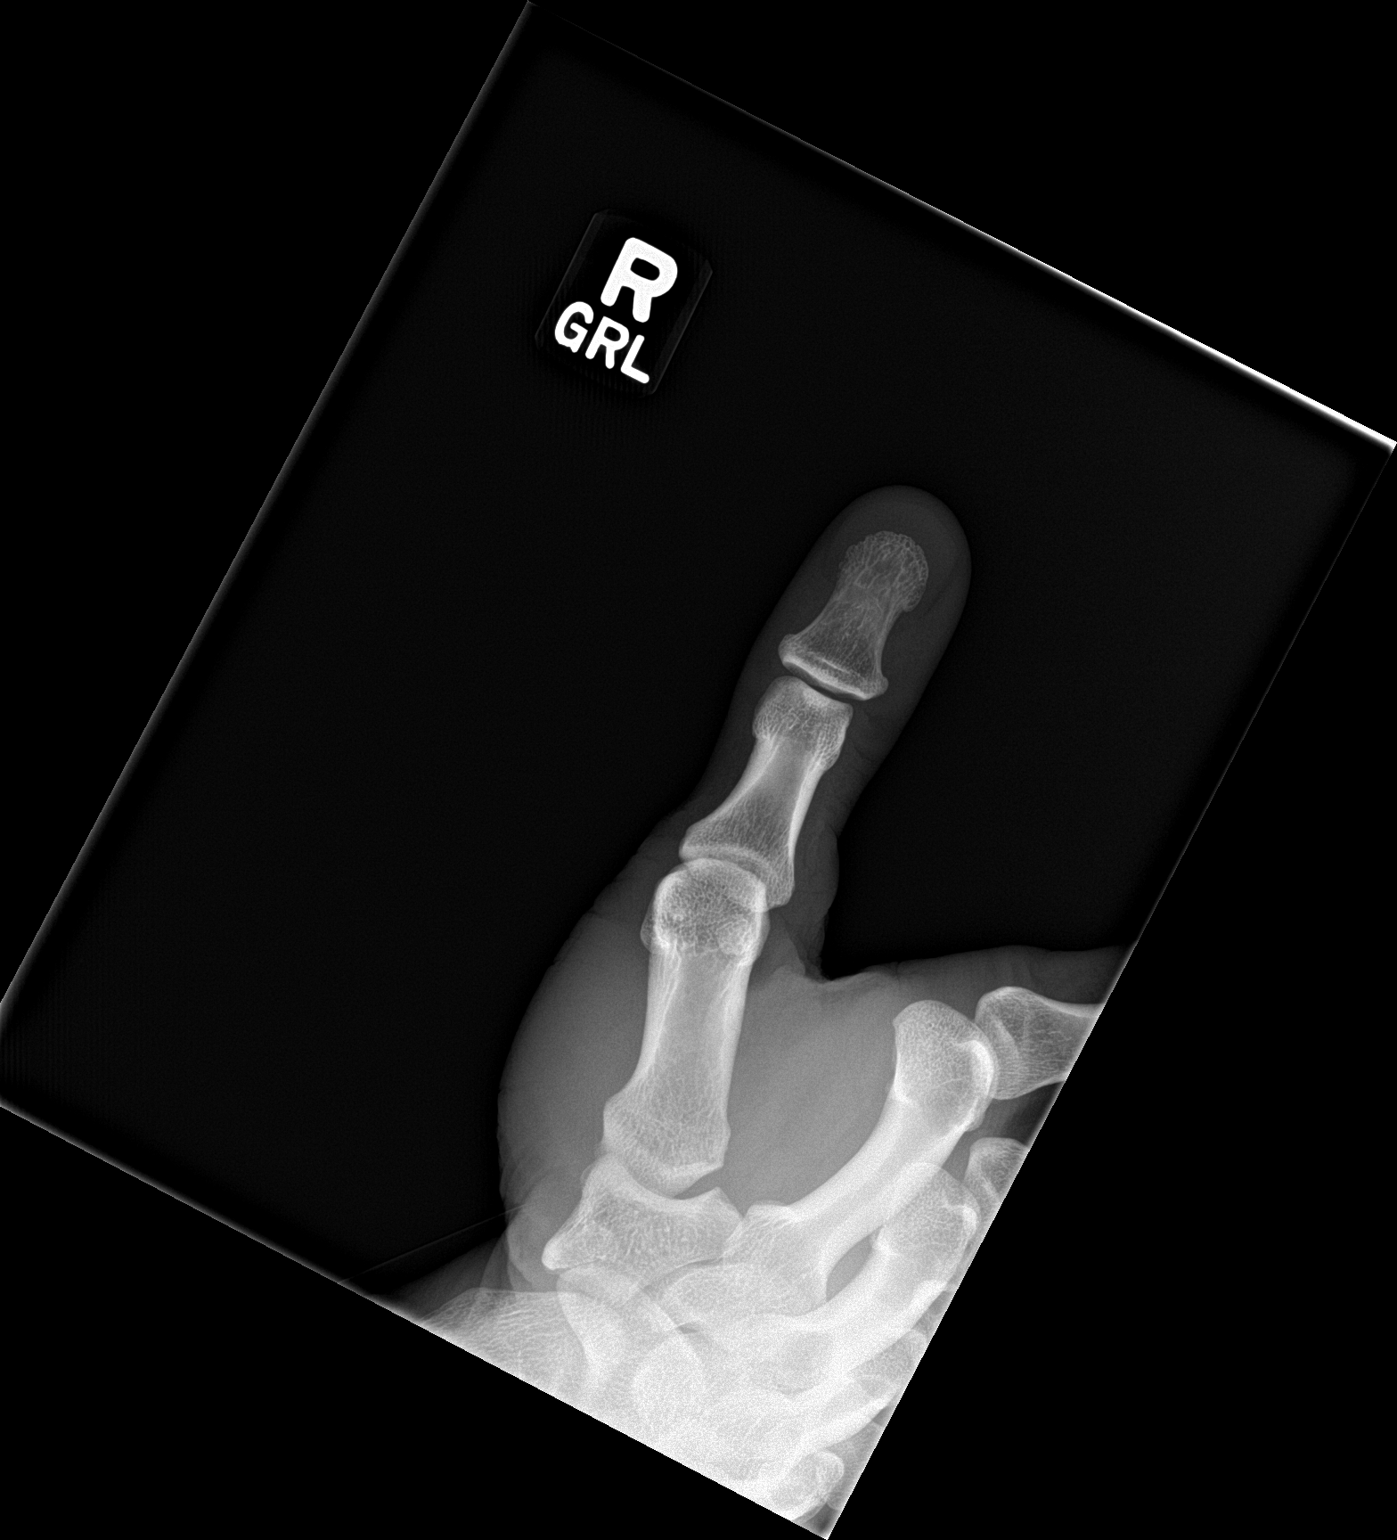

[finger obl]
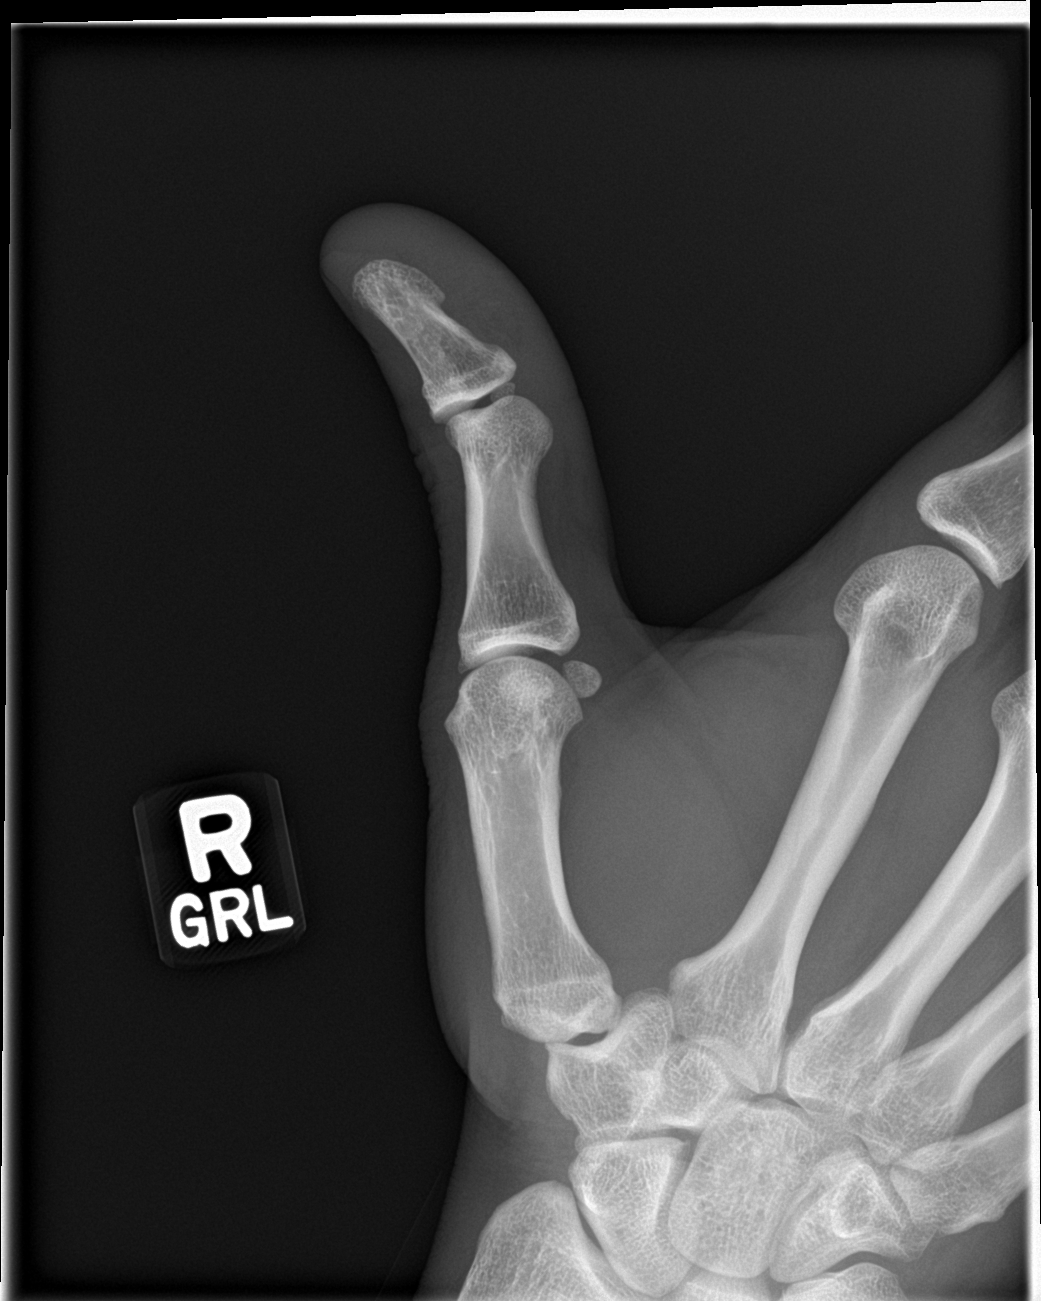

[finger lat]
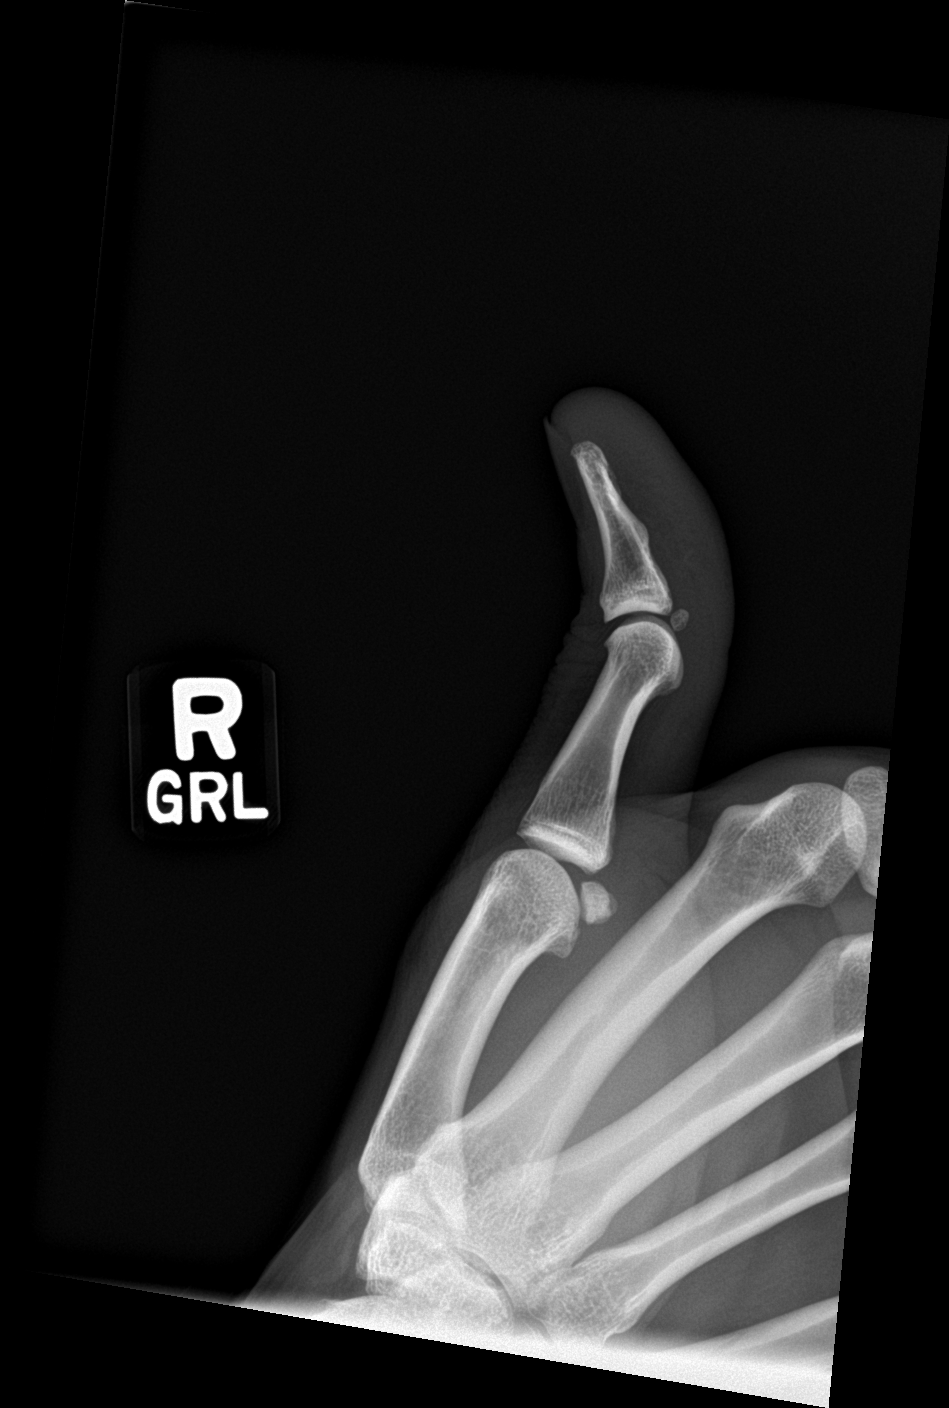

[3 of 3 positions shown; findings below may reference images not displayed]

FINDINGS: There is no evidence of fracture or dislocation. There is no
evidence of arthropathy or other focal bone abnormality. No evidence
of radiopaque foreign body.
IMPRESSION: Negative.  No evidence of fracture or radiopaque foreign body.

## 2019-07-01 ENCOUNTER — Telehealth: Payer: Self-pay

## 2019-07-01 NOTE — Telephone Encounter (Signed)
LM to call back.

## 2019-07-01 NOTE — Telephone Encounter (Signed)
Patient due for htn/depression follow up. Please call and schedule.

## 2019-10-05 NOTE — Telephone Encounter (Signed)
Phone number in system is wrong, no other number on file for pt

## 2020-01-20 ENCOUNTER — Other Ambulatory Visit: Payer: Self-pay

## 2020-01-20 ENCOUNTER — Emergency Department
Admission: EM | Admit: 2020-01-20 | Discharge: 2020-01-20 | Disposition: A | Discharge status: Home / Self Care | Payer: Self-pay | Attending: Emergency Medicine | Admitting: Emergency Medicine

## 2020-01-20 ENCOUNTER — Encounter: Payer: Self-pay | Admitting: Emergency Medicine

## 2020-01-20 DIAGNOSIS — H6123 Impacted cerumen, bilateral: Secondary | ICD-10-CM | POA: Insufficient documentation

## 2020-01-20 DIAGNOSIS — F17210 Nicotine dependence, cigarettes, uncomplicated: Secondary | ICD-10-CM | POA: Insufficient documentation

## 2020-01-20 MED ORDER — DOCUSATE SODIUM 100 MG PO CAPS
100.0000 mg | ORAL_CAPSULE | Freq: Once | ORAL | Status: AC
  Administered 2020-01-20: 100 mg via ORAL
  Filled 2020-01-20: qty 1

## 2020-01-20 MED ORDER — CARBAMIDE PEROXIDE 6.5 % OT SOLN: 5.0000 [drp] | Freq: Two times a day (BID) | OTIC | 2 refills | Status: AC

## 2020-01-20 NOTE — ED Notes (Signed)
Discharge instructions and prescription reviewed with patient. Pt verbalized understanding.  

## 2020-01-20 NOTE — ED Triage Notes (Signed)
Patient ambulatory to triage with steady gait, without difficulty or distress noted; pt reports wax build-up to rt ear and has muffled hearing

## 2020-01-20 NOTE — ED Provider Notes (Signed)
[facility] Emergency Department Provider Note   ____________________________________________   First MD Initiated Contact with Patient 01/20/20 [phone]     (approximate)  I have reviewed the triage vital signs and the nursing notes.   HISTORY  Chief Complaint Otalgia    HPI Manuel Miles is a 49 y.o. male patient presents with hearing loss right greater than left for several days.  Patient history of cerumen impaction.  Patient to try to remove wax with Q-tips only mild success.  Patient denies vertigo.  Patient denies URI signs.  Patient denies ear pain.         History reviewed. No pertinent past medical history.  There are no problems to display for this patient.   History reviewed. No pertinent surgical history.  Prior to Admission medications   Medication Sig Start Date End Date Taking? Authorizing Provider  carbamide peroxide (DEBROX) 6.5 % OTIC solution Place 5 drops into the right ear 2 (two) times daily. 01/20/20 01/19/21  Sable Feil, PA-C  hydrochlorothiazide (HYDRODIURIL) 12.5 MG tablet Take 1 tablet (12.5 mg total) by mouth daily. 04/02/18   Juline Patch, MD  sertraline (ZOLOFT) 25 MG tablet Take 1 tablet (25 mg total) by mouth daily. 04/02/18   Juline Patch, MD    Allergies Patient has no known allergies.  Family History  Problem Relation Age of Onset  . Diabetes Mother   . Hypertension Mother     Social History Social History   Tobacco Use  . Smoking status: Current Every Day Smoker    Packs/day: 0.50    Types: Cigarettes  . Smokeless tobacco: Never Used  Substance Use Topics  . Alcohol use: Yes    Alcohol/week: 1.0 standard drinks    Types: 1 Cans of beer per week    Comment: daily  . Drug use: No    Review of Systems  Constitutional: No fever/chills Eyes: No visual changes. ENT: No sore throat.  Hearing loss. Cardiovascular: Denies chest pain. Respiratory: Denies shortness of breath. Gastrointestinal:  No abdominal pain.  No nausea, no vomiting.  No diarrhea.  No constipation. Genitourinary: Negative for dysuria. Musculoskeletal: Negative for back pain. Skin: Negative for rash. Neurological: Negative for headaches, focal weakness or numbness.   ____________________________________________   PHYSICAL EXAM:  VITAL SIGNS: ED Triage Vitals [01/20/20 0629]  Enc Vitals Group     BP (!) 144/89     Pulse Rate (!) 103     Resp 18     Temp 98.6 F (37 C)     Temp Source Oral     SpO2 95 %     Weight 155 lb (70.3 kg)     Height '5\' 11"'  (1.803 m)     Head Circumference      Peak Flow      Pain Score 0     Pain Loc      Pain Edu?      Excl. in [place]?     Constitutional: Alert and oriented. Well appearing and in no acute distress. EARS:  Neck: No stridor.   Cardiovascular: Normal rate, regular rhythm. Grossly normal heart sounds.  Good peripheral circulation. Respiratory: Normal respiratory effort.  No retractions. Lungs CTAB. Neurologic:  Normal speech and language. No gross focal neurologic deficits are appreciated. No gait instability. Skin:  Skin is warm, dry and intact. No rash noted. Psychiatric: Mood and affect are normal. Speech and behavior are normal.  ____________________________________________   LABS (all labs ordered are  listed, but only abnormal results are displayed)  Labs Reviewed - No data to display ____________________________________________  EKG   ____________________________________________  RADIOLOGY  ED MD interpretation:    Official radiology report(s): No results found.  ____________________________________________   PROCEDURES  Procedure(s) performed (including Critical Care):  Procedures   ____________________________________________   INITIAL IMPRESSION / ASSESSMENT AND PLAN / ED COURSE  As part of my medical decision making, I reviewed the following data within the [facility]     Patient presents with mild  hearing loss secondary to bilateral cerumen impaction.  Patient obtain moderate relief after irrigation of the left ear.  Patient states mild relief after irrigation of the left ear.  Patient given discharge care instructions and a prescription for Debrox.  Patient advised to purchase over-the-counter earwax removal kit.  If no improvement in 1 week advised to follow ENT clinic.    Manuel Miles was evaluated in Emergency Department on 01/20/2020 for the symptoms described in the history of present illness. He was evaluated in the context of the global COVID-19 pandemic, which necessitated consideration that the patient might be at risk for infection with the SARS-CoV-2 virus that causes COVID-19. Institutional protocols and algorithms that pertain to the evaluation of patients at risk for COVID-19 are in a state of rapid change based on information released by regulatory bodies including the CDC and federal and state organizations. These policies and algorithms were followed during the patient's care in the ED.       ____________________________________________   FINAL CLINICAL IMPRESSION(S) / ED DIAGNOSES  Final diagnoses:  Bilateral impacted cerumen     ED Discharge Orders         Ordered    carbamide peroxide (DEBROX) 6.5 % OTIC solution  2 times daily     01/20/20 5916           Note:  This document was prepared using Dragon voice recognition software and may include unintentional dictation errors.    Sable Feil, PA-C 01/20/20 3846    Carrie Mew, MD 01/20/20 1538

## 2020-01-20 NOTE — Discharge Instructions (Addendum)
Follow discharge care instruction use eardrops as directed.  Advised to purchase an over-the-counter ear wax removal kit.  If no improvement in 1 week follow-up to ENT clinic.

## 2024-04-02 ENCOUNTER — Emergency Department: Payer: Self-pay

## 2024-04-02 ENCOUNTER — Other Ambulatory Visit: Payer: Self-pay

## 2024-04-02 ENCOUNTER — Encounter: Payer: Self-pay | Admitting: Emergency Medicine

## 2024-04-02 ENCOUNTER — Inpatient Hospital Stay
Admission: EM | Admit: 2024-04-02 | Discharge: 2024-04-04 | DRG: 439 | Disposition: A | Discharge status: Home / Self Care | Payer: Self-pay | Attending: Internal Medicine | Admitting: Internal Medicine

## 2024-04-02 DIAGNOSIS — R81 Glycosuria: Secondary | ICD-10-CM | POA: Diagnosis present

## 2024-04-02 DIAGNOSIS — R7989 Other specified abnormal findings of blood chemistry: Secondary | ICD-10-CM

## 2024-04-02 DIAGNOSIS — F10939 Alcohol use, unspecified with withdrawal, unspecified: Secondary | ICD-10-CM

## 2024-04-02 DIAGNOSIS — I10 Essential (primary) hypertension: Secondary | ICD-10-CM | POA: Diagnosis present

## 2024-04-02 DIAGNOSIS — K852 Alcohol induced acute pancreatitis without necrosis or infection: Principal | ICD-10-CM

## 2024-04-02 DIAGNOSIS — R519 Headache, unspecified: Secondary | ICD-10-CM | POA: Diagnosis present

## 2024-04-02 DIAGNOSIS — F1020 Alcohol dependence, uncomplicated: Secondary | ICD-10-CM | POA: Diagnosis present

## 2024-04-02 DIAGNOSIS — Z8249 Family history of ischemic heart disease and other diseases of the circulatory system: Secondary | ICD-10-CM

## 2024-04-02 DIAGNOSIS — K859 Acute pancreatitis without necrosis or infection, unspecified: Secondary | ICD-10-CM

## 2024-04-02 DIAGNOSIS — F419 Anxiety disorder, unspecified: Secondary | ICD-10-CM | POA: Diagnosis present

## 2024-04-02 DIAGNOSIS — R748 Abnormal levels of other serum enzymes: Secondary | ICD-10-CM | POA: Diagnosis present

## 2024-04-02 DIAGNOSIS — F10930 Alcohol use, unspecified with withdrawal, uncomplicated: Secondary | ICD-10-CM

## 2024-04-02 DIAGNOSIS — K921 Melena: Secondary | ICD-10-CM | POA: Diagnosis present

## 2024-04-02 DIAGNOSIS — E876 Hypokalemia: Secondary | ICD-10-CM | POA: Diagnosis not present

## 2024-04-02 DIAGNOSIS — R319 Hematuria, unspecified: Secondary | ICD-10-CM | POA: Diagnosis present

## 2024-04-02 DIAGNOSIS — R824 Acetonuria: Secondary | ICD-10-CM | POA: Diagnosis present

## 2024-04-02 DIAGNOSIS — Z833 Family history of diabetes mellitus: Secondary | ICD-10-CM

## 2024-04-02 DIAGNOSIS — E871 Hypo-osmolality and hyponatremia: Secondary | ICD-10-CM | POA: Diagnosis present

## 2024-04-02 DIAGNOSIS — R1011 Right upper quadrant pain: Principal | ICD-10-CM

## 2024-04-02 DIAGNOSIS — K922 Gastrointestinal hemorrhage, unspecified: Secondary | ICD-10-CM | POA: Diagnosis present

## 2024-04-02 DIAGNOSIS — F32A Depression, unspecified: Secondary | ICD-10-CM | POA: Diagnosis present

## 2024-04-02 DIAGNOSIS — F17210 Nicotine dependence, cigarettes, uncomplicated: Secondary | ICD-10-CM | POA: Diagnosis present

## 2024-04-02 DIAGNOSIS — Z79899 Other long term (current) drug therapy: Secondary | ICD-10-CM

## 2024-04-02 LAB — CBC WITH DIFFERENTIAL/PLATELET
Abs Immature Granulocytes: 0.02 10*3/uL (ref 0.00–0.07)
Basophils Absolute: 0 10*3/uL (ref 0.0–0.1)
Basophils Relative: 0 %
Eosinophils Absolute: 0 10*3/uL (ref 0.0–0.5)
Eosinophils Relative: 0 %
HCT: 48.5 % (ref 39.0–52.0)
Hemoglobin: 16.7 g/dL (ref 13.0–17.0)
Immature Granulocytes: 0 %
Lymphocytes Relative: 15 %
Lymphs Abs: 1.1 10*3/uL (ref 0.7–4.0)
MCH: 33.5 pg (ref 26.0–34.0)
MCHC: 34.4 g/dL (ref 30.0–36.0)
MCV: 97.4 fL (ref 80.0–100.0)
Monocytes Absolute: 0.6 10*3/uL (ref 0.1–1.0)
Monocytes Relative: 8 %
Neutro Abs: 5.7 10*3/uL (ref 1.7–7.7)
Neutrophils Relative %: 77 %
Platelets: 184 10*3/uL (ref 150–400)
RBC: 4.98 MIL/uL (ref 4.22–5.81)
RDW: 12 % (ref 11.5–15.5)
WBC: 7.4 10*3/uL (ref 4.0–10.5)
nRBC: 0 % (ref 0.0–0.2)

## 2024-04-02 LAB — TYPE AND SCREEN
ABO/RH(D): B POS
Antibody Screen: NEGATIVE

## 2024-04-02 LAB — COMPREHENSIVE METABOLIC PANEL WITH GFR
ALT: 116 U/L — ABNORMAL HIGH (ref 0–44)
AST: 104 U/L — ABNORMAL HIGH (ref 15–41)
Albumin: 4.4 g/dL (ref 3.5–5.0)
Alkaline Phosphatase: 64 U/L (ref 38–126)
Anion gap: 16 — ABNORMAL HIGH (ref 5–15)
BUN: 7 mg/dL (ref 6–20)
CO2: 22 mmol/L (ref 22–32)
Calcium: 9.2 mg/dL (ref 8.9–10.3)
Chloride: 96 mmol/L — ABNORMAL LOW (ref 98–111)
Creatinine, Ser: 0.79 mg/dL (ref 0.61–1.24)
GFR, Estimated: >60 mL/min (ref >60)
Glucose, Bld: 84 mg/dL (ref 70–99)
Potassium: 3.5 mmol/L (ref 3.5–5.1)
Sodium: 134 mmol/L — ABNORMAL LOW (ref 135–145)
Total Bilirubin: 1.5 mg/dL — ABNORMAL HIGH (ref 0.0–1.2)
Total Protein: 7.9 g/dL (ref 6.5–8.1)

## 2024-04-02 LAB — URINALYSIS, W/ REFLEX TO CULTURE (INFECTION SUSPECTED)
Bacteria, UA: NONE SEEN
Bilirubin Urine: NEGATIVE
Glucose, UA: >=500 mg/dL — AB
Ketones, ur: 80 mg/dL — AB
Leukocytes,Ua: NEGATIVE
Nitrite: NEGATIVE
Protein, ur: NEGATIVE mg/dL
Specific Gravity, Urine: 1.014 (ref 1.005–1.030)
pH: 5 (ref 5.0–8.0)

## 2024-04-02 LAB — PROTIME-INR
INR: 0.9 (ref 0.8–1.2)
Prothrombin Time: 12.6 s (ref 11.4–15.2)

## 2024-04-02 LAB — CBG MONITORING, ED: Glucose-Capillary: 114 mg/dL — ABNORMAL HIGH (ref 70–99)

## 2024-04-02 LAB — LIPASE, BLOOD: Lipase: 1339 U/L — ABNORMAL HIGH (ref 11–51)

## 2024-04-02 LAB — TROPONIN I (HIGH SENSITIVITY): Troponin I (High Sensitivity): 4 ng/L (ref <18)

## 2024-04-02 LAB — ETHANOL: Alcohol, Ethyl (B): <15 mg/dL (ref <15)

## 2024-04-02 MED ORDER — ACETAMINOPHEN 650 MG RE SUPP: 650.0000 mg | Freq: Four times a day (QID) | RECTAL | Status: DC | PRN

## 2024-04-02 MED ORDER — LORAZEPAM 1 MG PO TABS: 1.0000 mg | ORAL_TABLET | ORAL | Status: DC | PRN

## 2024-04-02 MED ORDER — PHENOBARBITAL SODIUM 130 MG/ML IJ SOLN
130.0000 mg | Freq: Once | INTRAMUSCULAR | Status: AC
  Administered 2024-04-02: 130 mg via INTRAVENOUS
  Filled 2024-04-02: qty 1

## 2024-04-02 MED ORDER — ACETAMINOPHEN 325 MG PO TABS: 650.0000 mg | ORAL_TABLET | Freq: Four times a day (QID) | ORAL | Status: DC | PRN

## 2024-04-02 MED ORDER — ADULT MULTIVITAMIN W/MINERALS CH
1.0000 | ORAL_TABLET | Freq: Every day | ORAL | Status: DC
  Administered 2024-04-02 – 2024-04-04 (×3): 1 via ORAL
  Filled 2024-04-02 (×3): qty 1

## 2024-04-02 MED ORDER — PANTOPRAZOLE SODIUM 40 MG IV SOLR
80.0000 mg | Freq: Once | INTRAVENOUS | Status: AC
  Administered 2024-04-02: 80 mg via INTRAVENOUS
  Filled 2024-04-02: qty 20

## 2024-04-02 MED ORDER — HYDROMORPHONE HCL 1 MG/ML IJ SOLN
0.5000 mg | INTRAMUSCULAR | Status: DC | PRN
  Administered 2024-04-02 – 2024-04-04 (×5): 1 mg via INTRAVENOUS
  Filled 2024-04-02 (×5): qty 1

## 2024-04-02 MED ORDER — LACTATED RINGERS IV SOLN: INTRAVENOUS | Status: DC

## 2024-04-02 MED ORDER — PHENOBARBITAL SODIUM 65 MG/ML IJ SOLN: 32.5000 mg | Freq: Three times a day (TID) | INTRAMUSCULAR | Status: DC

## 2024-04-02 MED ORDER — POLYETHYLENE GLYCOL 3350 17 G PO PACK: 17.0000 g | PACK | Freq: Every day | ORAL | Status: DC | PRN

## 2024-04-02 MED ORDER — THIAMINE MONONITRATE 100 MG PO TABS
100.0000 mg | ORAL_TABLET | Freq: Every day | ORAL | Status: DC
  Administered 2024-04-03 – 2024-04-04 (×2): 100 mg via ORAL
  Filled 2024-04-02 (×2): qty 1

## 2024-04-02 MED ORDER — OXYCODONE HCL 5 MG PO TABS
5.0000 mg | ORAL_TABLET | Freq: Four times a day (QID) | ORAL | Status: DC | PRN
  Administered 2024-04-03 – 2024-04-04 (×2): 5 mg via ORAL
  Filled 2024-04-02 (×2): qty 1

## 2024-04-02 MED ORDER — ONDANSETRON HCL 4 MG PO TABS
4.0000 mg | ORAL_TABLET | Freq: Four times a day (QID) | ORAL | Status: DC | PRN
Start: 2024-04-02 — End: 2024-04-04

## 2024-04-02 MED ORDER — THIAMINE HCL 100 MG/ML IJ SOLN
100.0000 mg | Freq: Once | INTRAMUSCULAR | Status: AC
  Administered 2024-04-02: 100 mg via INTRAVENOUS
  Filled 2024-04-02: qty 2

## 2024-04-02 MED ORDER — PANTOPRAZOLE SODIUM 40 MG IV SOLR
40.0000 mg | Freq: Two times a day (BID) | INTRAVENOUS | Status: DC
  Administered 2024-04-03 – 2024-04-04 (×3): 40 mg via INTRAVENOUS
  Filled 2024-04-02 (×3): qty 10

## 2024-04-02 MED ORDER — FOLIC ACID 1 MG PO TABS
1.0000 mg | ORAL_TABLET | Freq: Every day | ORAL | Status: DC
  Administered 2024-04-03 – 2024-04-04 (×2): 1 mg via ORAL
  Filled 2024-04-02 (×2): qty 1

## 2024-04-02 MED ORDER — PHENOBARBITAL SODIUM 130 MG/ML IJ SOLN
97.5000 mg | Freq: Three times a day (TID) | INTRAMUSCULAR | Status: DC
  Administered 2024-04-03 – 2024-04-04 (×5): 97.5 mg via INTRAVENOUS
  Filled 2024-04-02 (×5): qty 1

## 2024-04-02 MED ORDER — PHENOBARBITAL SODIUM 130 MG/ML IJ SOLN
130.0000 mg | Freq: Once | INTRAMUSCULAR | Status: AC
Start: 2024-04-02 — End: 2024-04-02
  Administered 2024-04-02: 130 mg via INTRAVENOUS
  Filled 2024-04-02: qty 1

## 2024-04-02 MED ORDER — LACTATED RINGERS IV BOLUS
1000.0000 mL | Freq: Once | INTRAVENOUS | Status: AC
  Administered 2024-04-02: 1000 mL via INTRAVENOUS

## 2024-04-02 MED ORDER — ONDANSETRON HCL 4 MG/2ML IJ SOLN: 4.0000 mg | Freq: Four times a day (QID) | INTRAMUSCULAR | Status: DC | PRN

## 2024-04-02 MED ORDER — LORAZEPAM 2 MG/ML IJ SOLN: 1.0000 mg | INTRAMUSCULAR | Status: DC | PRN

## 2024-04-02 MED ORDER — PHENOBARBITAL SODIUM 65 MG/ML IJ SOLN: 65.0000 mg | Freq: Three times a day (TID) | INTRAMUSCULAR | Status: DC

## 2024-04-02 MED ORDER — NICOTINE 7 MG/24HR TD PT24
7.0000 mg | MEDICATED_PATCH | Freq: Every day | TRANSDERMAL | Status: DC
  Administered 2024-04-02 – 2024-04-04 (×3): 7 mg via TRANSDERMAL
  Filled 2024-04-02 (×3): qty 1

## 2024-04-02 MED ORDER — SODIUM CHLORIDE 0.9% FLUSH
3.0000 mL | Freq: Two times a day (BID) | INTRAVENOUS | Status: DC
  Administered 2024-04-02 – 2024-04-04 (×4): 3 mL via INTRAVENOUS

## 2024-04-02 NOTE — Assessment & Plan Note (Signed)
 In the setting of daily alcohol use.  - Daily CMP

## 2024-04-02 NOTE — ED Notes (Signed)
 Room isn't clean

## 2024-04-02 NOTE — ED Triage Notes (Signed)
 Presents via EMS with some abd pain  States pain started couple of days ago  Pain is mainly RUQ  Also has been having intermittent dark stool   CBG 41 per ems  was given D10

## 2024-04-02 NOTE — Assessment & Plan Note (Signed)
 Patient endorsing several months of intermittent melena and hematochezia, which seems to alternate.  Hemoglobin is stable.  Will need outpatient follow-up with GI, but need to first resolve the pancreatitis.  - Daily CBC - Protonix 40 mg IV twice daily

## 2024-04-02 NOTE — Assessment & Plan Note (Signed)
 Patient is presenting with 5 days of epigastric abdominal pain with nausea and vomiting, found to have a markedly elevated lipase at 1300.  No complicating factors at this time.  -Telemetry monitoring - N.p.o. - Maintenance fluids as ordered - Zofran as needed for nausea - Tylenol, oxycodone and Dilaudid for pain control

## 2024-04-02 NOTE — ED Provider Notes (Addendum)
 [facility] Provider Note    Event Date/Time   First MD Initiated Contact with Patient 04/02/24 1611     (approximate)   History   Abdominal Pain   HPI  Manuel Miles is a 53 y.o. male with history of chronic alcohol use presenting with right upper quadrant abdominal pain as well as melena.  Patient states that he would get intermittent black stools then red in his stool.  Has some nausea/vomiting yesterday but none today.  No dysuria or diarrhea.  States that he does not have history of liver problems or cirrhosis.  Not on any blood thinners antiplatelets.  States that he does get tremors when he stops taking alcohol, last beer was this morning.  Per EMS when they got to him, his blood glucose was 41, they gave him a bag of D10.  Independent history obtained from EMS as above.   On independent chart review, he was last seen in 2019 by family medicine, history of hypertension, anxiety, was there for chronic headaches, is supposed to be on hydrochlorothiazide  for the hypertension, is supposed be on Zoloft  for the anxiety and depression.  Physical Exam   Triage Vital Signs: ED Triage Vitals  Encounter Vitals Group     BP --      Systolic BP Percentile --      Diastolic BP Percentile --      Pulse --      Resp --      Temp --      Temp src --      SpO2 --      Weight 04/02/24 1600 156 lb 8.4 oz (71 kg)     Height 04/02/24 1600 5\' 11"  (1.803 m)     Head Circumference --      Peak Flow --      Pain Score 04/02/24 1606 6     Pain Loc --      Pain Education --      Exclude from Growth Chart --     Most recent vital signs: Vitals:   04/02/24 1615  BP: (!) 146/98  Pulse: 88  Resp: 18  Temp: 98.4 F (36.9 C)  SpO2: 98%     General: Awake, no distress.  CV:  Good peripheral perfusion.  Resp:  Normal effort.  No tachypnea or respiratory distress Abd:  No distention.  Soft, mildly tender in the right upper quadrant and epigastric  region. Other:  No tongue fasciculations or tremors, mildly dry mucous membranes, rectal exam was done with chaperone, no external hemorrhoids, brown stool in rectal vault.   ED Results / Procedures / Treatments   Labs (all labs ordered are listed, but only abnormal results are displayed) Labs Reviewed  URINALYSIS, W/ REFLEX TO CULTURE (INFECTION SUSPECTED) - Abnormal; Notable for the following components:      Result Value   Color, Urine YELLOW (*)    APPearance CLEAR (*)    Glucose, UA >=500 (*)    Hgb urine dipstick SMALL (*)    Ketones, ur 80 (*)    All other components within normal limits  COMPREHENSIVE METABOLIC PANEL WITH GFR - Abnormal; Notable for the following components:   Sodium 134 (*)    Chloride 96 (*)    AST 104 (*)    ALT 116 (*)    Total Bilirubin 1.5 (*)    Anion gap 16 (*)    All other components within normal limits  LIPASE, BLOOD -  Abnormal; Notable for the following components:   Lipase 1,339 (*)    All other components within normal limits  CBG MONITORING, ED - Abnormal; Notable for the following components:   Glucose-Capillary 114 (*)    All other components within normal limits  CBC WITH DIFFERENTIAL/PLATELET  ETHANOL  PROTIME-INR  TYPE AND SCREEN  TYPE AND SCREEN  TROPONIN I (HIGH SENSITIVITY)  TROPONIN I (HIGH SENSITIVITY)     EKG  EKG shows, sinus rhythm, rate 84, normal QRS, normal QTc, no ischemic ST elevation, T wave flattening in 3, aVL, V5, V6, no prior to compare   RADIOLOGY On my independent interpretation, ultrasound without obvious gallstones   PROCEDURES:  Critical Care performed: Yes, see critical care procedure note(s)  .Critical Care  Performed by: Shane Darling, MD Authorized by: Shane Darling, MD   Critical care provider statement:    Critical care time (minutes):  40   Critical care was necessary to treat or prevent imminent or life-threatening deterioration of the following conditions: Alcohol withdrawal.    Critical care was time spent personally by me on the following activities:  Development of treatment plan with patient or surrogate, discussions with consultants, evaluation of patient's response to treatment, examination of patient, ordering and review of laboratory studies, ordering and review of radiographic studies, ordering and performing treatments and interventions, pulse oximetry, re-evaluation of patient's condition and review of old charts    MEDICATIONS ORDERED IN ED: Medications  PHENObarbital (LUMINAL) injection 130 mg (130 mg Intravenous Given 04/02/24 1727)  thiamine (VITAMIN B1) injection 100 mg (100 mg Intravenous Given 04/02/24 1727)  lactated ringers bolus 1,000 mL (1,000 mLs Intravenous New Bag/Given 04/02/24 1724)  pantoprazole (PROTONIX) injection 80 mg (80 mg Intravenous Given 04/02/24 1809)  PHENObarbital (LUMINAL) injection 130 mg (130 mg Intravenous Given 04/02/24 1810)     IMPRESSION / MDM / ASSESSMENT AND PLAN / ED COURSE  I reviewed the triage vital signs and the nursing notes.                              Differential diagnosis includes, but is not limited to, GI bleed, peptic ulcer disease, given frequent alcohol use, also considered variceal bleed, liver disease, cirrhosis, biliary colic, cholecystitis, anemia, atypical ACS, gastroenteritis, colitis, diverticulitis.  Will add labs, EKG, troponin, right upper quadrant ultrasound.  Will also type and screen and get a PT/INR.  Given his frequent alcohol use, will put him on a CIWA protocol.  Not in withdrawal at this time.  Patient's presentation is most consistent with acute presentation with potential threat to life or bodily function.  Independent interpretation of labs and imaging below.  On reassessment patient now having tongue fasciculations, will give him a dose of phenobarbital here.  On reassessment patient has some hand tremors, slight tongue fasciculations, will give him another dose of phenobarbital and plan to  admit him.  Shared decision making done with patient and wife and they are agreeable with the plan.  Consult hospitalist was agreeable plan for admission will evaluate the patient.  He is admitted.   The patient is on the cardiac monitor to evaluate for evidence of arrhythmia and/or significant heart rate changes.   Clinical Course as of 04/02/24 1832  Fri Apr 02, 2024  1710 Glucose-Capillary(!): 114 [TT]  1743 US  ABDOMEN LIMITED RUQ (LIVER/GB) No gallstones or ductal dilatation.  [TT]  1745 Independent review of labs, troponin is not elevated, electrolytes  not severely deranged, his AST, ALT and total bilirubin are mildly elevated.  UA is not consistent with UTI, no leukocytosis, hemoglobin 16. [TT]  1832 Lipase(!): 1,339 Consistent with acute pancreatitis [TT]    Clinical Course User Index [TT] Shane Darling, MD     FINAL CLINICAL IMPRESSION(S) / ED DIAGNOSES   Final diagnoses:  Right upper quadrant abdominal pain  Melena  Alcohol withdrawal syndrome without complication (HCC)  Acute pancreatitis, unspecified complication status, unspecified pancreatitis type     Rx / DC Orders   ED Discharge Orders     None        Note:  This document was prepared using Dragon voice recognition software and may include unintentional dictation errors.    Shane Darling, MD 04/02/24 1756    Shane Darling, MD 04/02/24 [phone]

## 2024-04-02 NOTE — Assessment & Plan Note (Signed)
 History of severe alcohol use disorder with prior withdrawal.  Last drink was earlier today, however vomiting afterwards.  Tremulous on presentation.  - CIWA monitoring with Ativan - Phenobarbital taper per pharmacy - Daily thiamine, folic acid and multivitamin - TOC consultation for resources

## 2024-04-02 NOTE — H&P (Signed)
 History and Physical    Patient: Manuel Miles [id number]:[id number] DOB: Feb 04, 1971 DOA: 04/02/2024 DOS: the patient was seen and examined on 04/02/2024 PCP: Pcp, No  Patient coming from: Home  Chief Complaint:  Chief Complaint  Patient presents with   Abdominal Pain   HPI: Manuel Miles is a 53 y.o. male with medical history significant of alcohol use disorder, who presents to the ED due to abdominal pain.  Manuel Miles states that Manuel Miles has been experiencing severe epigastric abdominal pain for approximately 5 days now.  The pain radiates down to his bilateral lower quadrants.  Manuel Miles endorses nausea with vomiting that is mostly mucus.  Manuel Miles endorses chronic blood in his stool, often alternating between bright red and black.  Most recently, Manuel Miles had an episode of black stool earlier today.  Manuel Miles notes that this has been happening for several months now.  Otherwise, Manuel Miles denies any chest pain, shortness of breath.  Manuel Miles states Manuel Miles drinks approximately 3  40 ounce beers per day and Manuel Miles smokes approximately half a pack of cigarettes a day.  Manuel Miles denies other drug use.  ED course: On arrival To the ED, patient was hypertensive at 146/98 with heart rate of 90.  Manuel Miles was saturating at 100% on room air.  Manuel Miles was afebrile at 98.4.  Initial workup notable for unremarkable CBC, AST 104, ALT 116, creatinine 0.79 with GFR above 60, and lipase of 1300.  Urinalysis with glucosuria, hematuria, ketonuria.  Right upper quadrant ultrasound obtained with no acute findings.  Patient started on IV fluids, phenobarbital, and Protonix.  TRH contacted for admission.  Review of Systems: As mentioned in the history of present illness. All other systems reviewed and are negative.  History reviewed. No pertinent past medical history.  History reviewed. No pertinent surgical history.  Social History:  reports that Manuel Miles has been smoking cigarettes. Manuel Miles has never used smokeless tobacco. Manuel Miles reports current alcohol use of about 1.0 standard  drink of alcohol per week. Manuel Miles reports that Manuel Miles does not use drugs.  No Known Allergies  Family History  Problem Relation Age of Onset   Diabetes Mother    Hypertension Mother     Prior to Admission medications   Medication Sig Start Date End Date Taking? Authorizing Provider  hydrochlorothiazide  (HYDRODIURIL ) 12.5 MG tablet Take 1 tablet (12.5 mg total) by mouth daily. Patient not taking: Reported on 04/02/2024 04/02/18   [name], MD  sertraline  (ZOLOFT ) 25 MG tablet Take 1 tablet (25 mg total) by mouth daily. Patient not taking: Reported on 04/02/2024 04/02/18   [name], MD    Physical Exam: Vitals:   04/02/24 1600 04/02/24 1615  BP:  (!) 146/98  Pulse:  88  Resp:  18  Temp:  98.4 F (36.9 C)  TempSrc:  Oral  SpO2:  98%  Weight: 71 kg   Height: 5\' 11"  (1.803 m)    Physical Exam Vitals and nursing note reviewed.  Constitutional:      General: Manuel Miles is not in acute distress.    Appearance: Manuel Miles is normal weight. Manuel Miles is not toxic-appearing.  HENT:     Head: Normocephalic and atraumatic.     Mouth/Throat:     Comments: Dry Eyes:     General: No scleral icterus.    Extraocular Movements: Extraocular movements intact.  Cardiovascular:     Rate and Rhythm: Regular rhythm. Tachycardia present.     Heart sounds: No murmur heard. Pulmonary:     Effort: Pulmonary effort  is normal. No respiratory distress.     Breath sounds: Normal breath sounds.  Abdominal:     General: Bowel sounds are decreased. There is no distension.     Palpations: Abdomen is soft.     Tenderness: There is abdominal tenderness in the epigastric area and periumbilical area. There is no guarding.  Musculoskeletal:     Right lower leg: No edema.     Left lower leg: No edema.  Skin:    General: Skin is warm and dry.  Neurological:     General: No focal deficit present.     Mental Status: Manuel Miles is alert and oriented to person, place, and time.     Motor: Tremor (mild) present.  Psychiatric:         Mood and Affect: Mood normal.        Behavior: Behavior normal.    Data Reviewed: CBC with WBC of 7.4, hemoglobin of 16.7, platelets of 184 CMP with sodium of 134, potassium 3.5, bicarb 22, glucose 84, BUN 7, creatinine 0.79, AST 104, ALT 116, total bilirubin 1.5 and GFR above 60 Lipase 1339 Troponin 4 INR 0.9  EKG personally reviewed.  Sinus rhythm with rate of 84.  No ST or T wave changes concerning for acute ischemia.  US  ABDOMEN LIMITED RUQ (LIVER/GB) Result Date: 04/02/2024 CLINICAL DATA:  Right upper quadrant pain for 3 days EXAM: ULTRASOUND ABDOMEN LIMITED RIGHT UPPER QUADRANT COMPARISON:  None Available. FINDINGS: Gallbladder: No gallstones or wall thickening visualized. No sonographic Murphy sign noted by sonographer. Common bile duct: Diameter: 4 mm Liver: No focal lesion identified. Within normal limits in parenchymal echogenicity. Portal vein is patent on color Doppler imaging with normal direction of blood flow towards the liver. Other: None. IMPRESSION: No gallstones or ductal dilatation. Electronically Signed   By: Adrianna Horde M.D.   On: 04/02/2024 17:38   Results are pending, will review when available.  Assessment and Plan:  * Alcohol induced acute pancreatitis Patient is presenting with 5 days of epigastric abdominal pain with nausea and vomiting, found to have a markedly elevated lipase at 1300.  No complicating factors at this time.  -Telemetry monitoring - N.p.o. - Maintenance fluids as ordered - Zofran as needed for nausea - Tylenol, oxycodone and Dilaudid for pain control  Alcohol withdrawal (HCC) History of severe alcohol use disorder with prior withdrawal.  Last drink was earlier today, however vomiting afterwards.  Tremulous on presentation.  - CIWA monitoring with Ativan - Phenobarbital taper per pharmacy - Daily thiamine, folic acid and multivitamin - TOC consultation for resources  GI bleed Patient endorsing several months of intermittent melena and  hematochezia, which seems to alternate.  Hemoglobin is stable.  Will need outpatient follow-up with GI, but need to first resolve the pancreatitis.  - Daily CBC - Protonix 40 mg IV twice daily  Elevated LFTs In the setting of daily alcohol use.  - Daily CMP  Advance Care Planning:   Code Status: Full Code   Consults: None  Family Communication: Patient's wife updated at bedside  Severity of Illness: The appropriate patient status for this patient is OBSERVATION. Observation status is judged to be reasonable and necessary in order to provide the required intensity of service to ensure the patient's safety. The patient's presenting symptoms, physical exam findings, and initial radiographic and laboratory data in the context of their medical condition is felt to place them at decreased risk for further clinical deterioration. Furthermore, it is anticipated that the patient will be  medically stable for discharge from the hospital within 2 midnights of admission.   Author: Avi Body, MD 04/02/2024 7:29 PM  For on call review [email or web address].

## 2024-04-02 NOTE — ED Notes (Signed)
 Pt stated that for the past 6 months he's been having off and on bright red blood in stool. Last occurrence was sometime this week and then it turned to dark black stool

## 2024-04-03 DIAGNOSIS — K922 Gastrointestinal hemorrhage, unspecified: Secondary | ICD-10-CM | POA: Diagnosis present

## 2024-04-03 LAB — CBC
HCT: 39.9 % (ref 39.0–52.0)
Hemoglobin: 14 g/dL (ref 13.0–17.0)
MCH: 34.1 pg — ABNORMAL HIGH (ref 26.0–34.0)
MCHC: 35.1 g/dL (ref 30.0–36.0)
MCV: 97.3 fL (ref 80.0–100.0)
Platelets: 170 10*3/uL (ref 150–400)
RBC: 4.1 MIL/uL — ABNORMAL LOW (ref 4.22–5.81)
RDW: 11.9 % (ref 11.5–15.5)
WBC: 7.1 10*3/uL (ref 4.0–10.5)
nRBC: 0 % (ref 0.0–0.2)

## 2024-04-03 LAB — COMPREHENSIVE METABOLIC PANEL WITH GFR
ALT: 77 U/L — ABNORMAL HIGH (ref 0–44)
AST: 56 U/L — ABNORMAL HIGH (ref 15–41)
Albumin: 3.6 g/dL (ref 3.5–5.0)
Alkaline Phosphatase: 50 U/L (ref 38–126)
Anion gap: 9 (ref 5–15)
BUN: 5 mg/dL — ABNORMAL LOW (ref 6–20)
CO2: 28 mmol/L (ref 22–32)
Calcium: 8.7 mg/dL — ABNORMAL LOW (ref 8.9–10.3)
Chloride: 96 mmol/L — ABNORMAL LOW (ref 98–111)
Creatinine, Ser: 0.77 mg/dL (ref 0.61–1.24)
GFR, Estimated: >60 mL/min (ref >60)
Glucose, Bld: 100 mg/dL — ABNORMAL HIGH (ref 70–99)
Potassium: 3.2 mmol/L — ABNORMAL LOW (ref 3.5–5.1)
Sodium: 133 mmol/L — ABNORMAL LOW (ref 135–145)
Total Bilirubin: 1.3 mg/dL — ABNORMAL HIGH (ref 0.0–1.2)
Total Protein: 6.3 g/dL — ABNORMAL LOW (ref 6.5–8.1)

## 2024-04-03 LAB — MAGNESIUM: Magnesium: 2 mg/dL (ref 1.7–2.4)

## 2024-04-03 LAB — PHOSPHORUS: Phosphorus: 3.4 mg/dL (ref 2.5–4.6)

## 2024-04-03 LAB — HIV ANTIBODY (ROUTINE TESTING W REFLEX): HIV Screen 4th Generation wRfx: NONREACTIVE

## 2024-04-03 MED ORDER — ORAL CARE MOUTH RINSE: 15.0000 mL | OROMUCOSAL | Status: DC | PRN

## 2024-04-03 MED ORDER — POTASSIUM CHLORIDE CRYS ER 20 MEQ PO TBCR
40.0000 meq | EXTENDED_RELEASE_TABLET | ORAL | Status: AC
  Administered 2024-04-03 (×2): 40 meq via ORAL
  Filled 2024-04-03 (×2): qty 2

## 2024-04-03 NOTE — Progress Notes (Addendum)
 Progress Note    Manuel Miles  NFA:[id number] DOB: 04/28/1971  DOA: 04/02/2024 PCP: Pcp, No      Brief Narrative:    Medical records reviewed and are as summarized below:  Manuel Miles is a 53 y.o. male with medical history significant for alcohol use disorder, tobacco use disorder, who presented to the hospital because of abdominal pain, nausea and vomiting.  He also reported having bloody stools and sometimes black stools which has been going on for several months.  He drinks about 3 40 ounce beers every day and smokes about half a pack of cigarettes a day.  Lipase was 1,339. He was admitted to the hospital for acute pancreatitis.    Assessment/Plan:   Principal Problem:   Alcohol induced acute pancreatitis Active Problems:   Alcohol withdrawal (HCC)   GI bleed   Elevated LFTs   Upper GI bleed    Body mass index is 21.83 kg/m.   Acute pancreatitis, likely alcohol induced: Start clear liquid diet and advance diet as tolerated.  Discontinue IV fluids.  Continue analgesics as needed for pain.  Antiemetics as needed for nausea/vomiting.   Hypokalemia: Replete potassium and monitor levels Hyponatremia: Repeat BMP tomorrow   Elevated liver enzymes: Probably due to alcohol.  Repeat liver enzymes tomorrow.   Alcohol use disorder, tobacco use disorder: Counseled to quit. Use Ativan as needed per CIWA protocol.  Continue IV phenobarbital taper.   Reports of chronic intermittent bloody/black stools: H&H stable.  Outpatient follow-up with gastroenterologist/PCP.    Diet Order             Diet clear liquid Room service appropriate? Yes; Fluid consistency: Thin  Diet effective now                            Consultants: None  Procedures: None    Medications:    folic acid  1 mg Oral Daily   multivitamin with minerals  1 tablet Oral Daily   nicotine  7 mg Transdermal Daily   pantoprazole (PROTONIX) IV  40 mg Intravenous Q12H    PHENObarbital  97.5 mg Intravenous Q8H   Followed by   Cecily Cohen ON 04/05/2024] PHENObarbital  65 mg Intravenous Q8H   Followed by   Cecily Cohen ON 04/07/2024] PHENObarbital  32.5 mg Intravenous Q8H   sodium chloride flush  3 mL Intravenous Q12H   thiamine  100 mg Oral Daily   Continuous Infusions:  lactated ringers 150 mL/hr at 04/03/24 0917     Anti-infectives (From admission, onward)    None              Family Communication/Anticipated D/C date and plan/Code Status   DVT prophylaxis: SCDs Start: 04/02/24 1903     Code Status: Full Code  Family Communication: None Disposition Plan: Plan to discharge home   Status is: Inpatient Remains inpatient appropriate because: Acute pancreatitis       Subjective:   Interval events noted.  Abdominal pain is better.  No nausea or vomiting.  Objective:    Vitals:   04/02/24 2157 04/03/24 0414 04/03/24 0732 04/03/24 1145  BP: 133/88 110/83 114/82 114/73  Pulse: 89 73 79 73  Resp: 16 20 16    Temp: 98.8 F (37.1 C) 97.9 F (36.6 C) 98.2 F (36.8 C) 98.1 F (36.7 C)  TempSrc:  Oral    SpO2: 100% 96% 96% 100%  Weight:      Height:  No data found.   Intake/Output Summary (Last 24 hours) at 04/03/2024 1435 Last data filed at 04/02/2024 1939 Gross per 24 hour  Intake 1000 ml  Output --  Net 1000 ml   Filed Weights   04/02/24 1600  Weight: 71 kg    Exam:  GEN: NAD SKIN: Warm and dry EYES: No pallor or icterus ENT: MMM CV: RRR PULM: CTA B ABD: soft, ND, NT, +BS CNS: AAO x 3, non focal EXT: No edema or tenderness        Data Reviewed:   I have personally reviewed following labs and imaging studies:  Labs: Labs show the following:   Basic Metabolic Panel: Recent Labs  Lab 04/02/24 1716 04/03/24 0428  NA 134* 133*  K 3.5 3.2*  CL 96* 96*  CO2 22 28  GLUCOSE 84 100*  BUN 7 5*  CREATININE 0.79 0.77  CALCIUM 9.2 8.7*  MG  --  2.0  PHOS  --  3.4   GFR Estimated Creatinine  Clearance: 108.5 mL/min (by C-G formula based on SCr of 0.77 mg/dL). Liver Function Tests: Recent Labs  Lab 04/02/24 1716 04/03/24 0428  AST 104* 56*  ALT 116* 77*  ALKPHOS 64 50  BILITOT 1.5* 1.3*  PROT 7.9 6.3*  ALBUMIN 4.4 3.6   Recent Labs  Lab 04/02/24 1716  LIPASE 1,339*   No results for input(s): "AMMONIA" in the last 168 hours. Coagulation profile Recent Labs  Lab 04/02/24 1716  INR 0.9    CBC: Recent Labs  Lab 04/02/24 1622 04/03/24 0428  WBC 7.4 7.1  NEUTROABS 5.7  --   HGB 16.7 14.0  HCT 48.5 39.9  MCV 97.4 97.3  PLT 184 170   Cardiac Enzymes: No results for input(s): "CKTOTAL", "CKMB", "CKMBINDEX", "TROPONINI" in the last 168 hours. BNP (last 3 results) No results for input(s): "PROBNP" in the last 8760 hours. CBG: Recent Labs  Lab 04/02/24 1650  GLUCAP 114*   D-Dimer: No results for input(s): "DDIMER" in the last 72 hours. Hgb A1c: No results for input(s): "HGBA1C" in the last 72 hours. Lipid Profile: No results for input(s): "CHOL", "HDL", "LDLCALC", "TRIG", "CHOLHDL", "LDLDIRECT" in the last 72 hours. Thyroid function studies: No results for input(s): "TSH", "T4TOTAL", "T3FREE", "THYROIDAB" in the last 72 hours.  Invalid input(s): "FREET3" Anemia work up: No results for input(s): "VITAMINB12", "FOLATE", "FERRITIN", "TIBC", "IRON", "RETICCTPCT" in the last 72 hours. Sepsis Labs: Recent Labs  Lab 04/02/24 1622 04/03/24 0428  WBC 7.4 7.1    Microbiology No results found for this or any previous visit (from the past 240 hours).  Procedures and diagnostic studies:  US  ABDOMEN LIMITED RUQ (LIVER/GB) Result Date: 04/02/2024 CLINICAL DATA:  Right upper quadrant pain for 3 days EXAM: ULTRASOUND ABDOMEN LIMITED RIGHT UPPER QUADRANT COMPARISON:  None Available. FINDINGS: Gallbladder: No gallstones or wall thickening visualized. No sonographic Murphy sign noted by sonographer. Common bile duct: Diameter: 4 mm Liver: No focal lesion  identified. Within normal limits in parenchymal echogenicity. Portal vein is patent on color Doppler imaging with normal direction of blood flow towards the liver. Other: None. IMPRESSION: No gallstones or ductal dilatation. Electronically Signed   By: Adrianna Horde M.D.   On: 04/02/2024 17:38               LOS: 0 days   Makynlie Rossini  [facility]   Pager on [email or web address]. If 7PM-7AM, please contact night-coverage at [email or web address]     04/03/2024, 2:35 PM

## 2024-04-03 NOTE — Plan of Care (Signed)

## 2024-04-04 LAB — CBC
HCT: 40.7 % (ref 39.0–52.0)
Hemoglobin: 13.9 g/dL (ref 13.0–17.0)
MCH: 33 pg (ref 26.0–34.0)
MCHC: 34.2 g/dL (ref 30.0–36.0)
MCV: 96.7 fL (ref 80.0–100.0)
Platelets: 177 10*3/uL (ref 150–400)
RBC: 4.21 MIL/uL — ABNORMAL LOW (ref 4.22–5.81)
RDW: 11.8 % (ref 11.5–15.5)
WBC: 6 10*3/uL (ref 4.0–10.5)
nRBC: 0 % (ref 0.0–0.2)

## 2024-04-04 LAB — COMPREHENSIVE METABOLIC PANEL WITH GFR
ALT: 56 U/L — ABNORMAL HIGH (ref 0–44)
AST: 33 U/L (ref 15–41)
Albumin: 3.5 g/dL (ref 3.5–5.0)
Alkaline Phosphatase: 53 U/L (ref 38–126)
Anion gap: 8 (ref 5–15)
BUN: <5 mg/dL — ABNORMAL LOW (ref 6–20)
CO2: 27 mmol/L (ref 22–32)
Calcium: 8.8 mg/dL — ABNORMAL LOW (ref 8.9–10.3)
Chloride: 100 mmol/L (ref 98–111)
Creatinine, Ser: 0.8 mg/dL (ref 0.61–1.24)
GFR, Estimated: >60 mL/min (ref >60)
Glucose, Bld: 93 mg/dL (ref 70–99)
Potassium: 4.3 mmol/L (ref 3.5–5.1)
Sodium: 135 mmol/L (ref 135–145)
Total Bilirubin: 0.9 mg/dL (ref 0.0–1.2)
Total Protein: 6.4 g/dL — ABNORMAL LOW (ref 6.5–8.1)

## 2024-04-04 LAB — LIPASE, BLOOD: Lipase: 559 U/L — ABNORMAL HIGH (ref 11–51)

## 2024-04-04 LAB — PHOSPHORUS: Phosphorus: 2.9 mg/dL (ref 2.5–4.6)

## 2024-04-04 LAB — MAGNESIUM: Magnesium: 2.3 mg/dL (ref 1.7–2.4)

## 2024-04-04 NOTE — Discharge Summary (Signed)
 Physician Discharge Summary   Patient: Manuel Miles MRN: [id number] DOB: October 06, 1971  Admit date:     04/02/2024  Discharge date: 04/04/24  Discharge Physician: Sheril Dines   PCP: Pcp, No   Recommendations at discharge:   Recommended that he establish care with a PCP as soon as possible for routine health maintenance He has been advised to avoid alcohol and cigarettes  Discharge Diagnoses: Principal Problem:   Alcohol induced acute pancreatitis Active Problems:   Alcohol withdrawal (HCC)   GI bleed   Elevated LFTs   Upper GI bleed  Resolved Problems:   * No resolved hospital problems. *  Hospital Course:  Manuel Miles is a 53 y.o. male with medical history significant for alcohol use disorder, tobacco use disorder, who presented to the hospital because of abdominal pain, nausea and vomiting.  He also reported having bloody stools and sometimes black stools which has been going on for several months.  He drinks about 3 40 ounce beers every day and smokes about half a pack of cigarettes a day.   Lipase was 1,339. He was admitted to the hospital for acute pancreatitis.    Assessment and Plan:   Acute pancreatitis, likely alcohol induced: Improved.  Lipase down from [phone].  He tolerated a regular diet.   He has been advised to avoid alcohol    Hypokalemia and hyponatremia: Improved     Elevated liver enzymes: Probably due to alcohol.  Counseled to quit alcohol.  Outpatient monitoring of liver enzymes recommended   Alcohol use disorder, tobacco use disorder: Counseled to quit.     Reports of chronic intermittent bloody/black stools: H&H stable.  No report of bloody stools in the hospital.  He has been advised to avoid aspirin and NSAIDs.  Outpatient follow-up with gastroenterologist/PCP.          Consultants: None Procedures performed: None Disposition: Home Diet recommendation:  Discharge Diet Orders (From admission, onward)     Start     Ordered    04/04/24 0000  Diet - low sodium heart healthy        04/04/24 1204           Cardiac diet DISCHARGE MEDICATION: Allergies as of 04/04/2024   No Known Allergies      Medication List     STOP taking these medications    hydrochlorothiazide  12.5 MG tablet Commonly known as: HYDRODIURIL    sertraline  25 MG tablet Commonly known as: ZOLOFT         Discharge Exam: Filed Weights   04/02/24 1600  Weight: 71 kg   GEN: NAD SKIN: Warm and dry EYES: No pallor or icterus ENT: MMM CV: RRR PULM: CTA B ABD: soft, ND, NT, +BS CNS: AAO x 3, non focal EXT: No edema or tenderness   Condition at discharge: good  The results of significant diagnostics from this hospitalization (including imaging, microbiology, ancillary and laboratory) are listed below for reference.   Imaging Studies: US  ABDOMEN LIMITED RUQ (LIVER/GB) Result Date: 04/02/2024 CLINICAL DATA:  Right upper quadrant pain for 3 days EXAM: ULTRASOUND ABDOMEN LIMITED RIGHT UPPER QUADRANT COMPARISON:  None Available. FINDINGS: Gallbladder: No gallstones or wall thickening visualized. No sonographic Murphy sign noted by sonographer. Common bile duct: Diameter: 4 mm Liver: No focal lesion identified. Within normal limits in parenchymal echogenicity. Portal vein is patent on color Doppler imaging with normal direction of blood flow towards the liver. Other: None. IMPRESSION: No gallstones or ductal dilatation. Electronically Signed   By: Adrianna Horde  M.D.   On: 04/02/2024 17:38    Microbiology: No results found for this or any previous visit.  Labs: CBC: Recent Labs  Lab 04/02/24 1622 04/03/24 0428 04/04/24 0414  WBC 7.4 7.1 6.0  NEUTROABS 5.7  --   --   HGB 16.7 14.0 13.9  HCT 48.5 39.9 40.7  MCV 97.4 97.3 96.7  PLT 184 170 177   Basic Metabolic Panel: Recent Labs  Lab 04/02/24 1716 04/03/24 0428 04/04/24 0414  NA 134* 133* 135  K 3.5 3.2* 4.3  CL 96* 96* 100  CO2 22 28 27   GLUCOSE 84 100* 93  BUN 7 5*  <5*  CREATININE 0.79 0.77 0.80  CALCIUM 9.2 8.7* 8.8*  MG  --  2.0 2.3  PHOS  --  3.4 2.9   Liver Function Tests: Recent Labs  Lab 04/02/24 1716 04/03/24 0428 04/04/24 0414  AST 104* 56* 33  ALT 116* 77* 56*  ALKPHOS 64 50 53  BILITOT 1.5* 1.3* 0.9  PROT 7.9 6.3* 6.4*  ALBUMIN 4.4 3.6 3.5   CBG: Recent Labs  Lab 04/02/24 1650  GLUCAP 114*    Discharge time spent: less than 30 minutes.  Signed: Sheril Dines, MD [facility] 04/04/2024

## 2024-04-04 NOTE — Plan of Care (Signed)
